# Patient Record
Sex: Female | Born: 1990 | Race: Asian | Hispanic: No | Smoking: Never smoker
Health system: Southern US, Community
[De-identification: ages and names within clinical notes are randomized; demographics above are authoritative.]

## PROBLEM LIST (undated history)

## (undated) ENCOUNTER — Inpatient Hospital Stay (HOSPITAL_COMMUNITY): Payer: Self-pay

## (undated) DIAGNOSIS — R87629 Unspecified abnormal cytological findings in specimens from vagina: Secondary | ICD-10-CM

## (undated) DIAGNOSIS — K219 Gastro-esophageal reflux disease without esophagitis: Secondary | ICD-10-CM

## (undated) DIAGNOSIS — D649 Anemia, unspecified: Secondary | ICD-10-CM

## (undated) DIAGNOSIS — K644 Residual hemorrhoidal skin tags: Secondary | ICD-10-CM

## (undated) HISTORY — DX: Anemia, unspecified: D64.9

## (undated) HISTORY — DX: Unspecified abnormal cytological findings in specimens from vagina: R87.629

## (undated) HISTORY — PX: NO PAST SURGERIES: SHX2092

## (undated) HISTORY — PX: WISDOM TOOTH EXTRACTION: SHX21

## (undated) HISTORY — DX: Residual hemorrhoidal skin tags: K64.4

---
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
## 2013-04-12 ENCOUNTER — Emergency Department (INDEPENDENT_AMBULATORY_CARE_PROVIDER_SITE_OTHER)
Admission: EM | Admit: 2013-04-12 | Discharge: 2013-04-12 | Disposition: A | Discharge status: Home / Self Care | Payer: Self-pay | Source: Home / Self Care

## 2013-04-12 ENCOUNTER — Encounter (HOSPITAL_COMMUNITY): Payer: Self-pay | Admitting: Emergency Medicine

## 2013-04-12 DIAGNOSIS — R824 Acetonuria: Secondary | ICD-10-CM

## 2013-04-12 DIAGNOSIS — R5383 Other fatigue: Secondary | ICD-10-CM

## 2013-04-12 DIAGNOSIS — R630 Anorexia: Secondary | ICD-10-CM

## 2013-04-12 LAB — POCT I-STAT, CHEM 8
Calcium, Ion: 1.26 mmol/L — ABNORMAL HIGH (ref 1.12–1.23)
Chloride: 105 mEq/L (ref 96–112)
HCT: 45 % (ref 36.0–46.0)
Sodium: 141 mEq/L (ref 135–145)
TCO2: 26 mmol/L (ref 0–100)

## 2013-04-12 LAB — POCT URINALYSIS DIP (DEVICE)
Bilirubin Urine: NEGATIVE
Glucose, UA: NEGATIVE mg/dL
Nitrite: NEGATIVE
Specific Gravity, Urine: 1.02 (ref 1.005–1.030)
Urobilinogen, UA: 0.2 mg/dL (ref 0.0–1.0)

## 2013-04-12 LAB — POCT INFECTIOUS MONO SCREEN: Mono Screen: NEGATIVE

## 2013-04-12 LAB — POCT PREGNANCY, URINE: Preg Test, Ur: NEGATIVE

## 2013-04-12 NOTE — ED Notes (Signed)
Pt c/o feeling weak and tired constantly onset 2 weeks Sx also include sensation that her face/throat is swelling, body aches, loss of appetite Denies: f/v/d, constipation, abd pain   She is alert and oriented w/no signs of acute distress.

## 2013-04-12 NOTE — ED Provider Notes (Signed)
History     CSN: [id number]  Arrival date & time 04/12/13  1249   First MD Initiated Contact with Patient 04/12/13 1310      Chief Complaint  Patient presents with  . Fatigue    (Consider location/radiation/quality/duration/timing/severity/associated sxs/prior treatment) HPI Comments: Healthy-appearing 22 year old Asian female complaining of being tired for 2 weeks. She has a sensation of swelling in the right side of throat, neck and face. He is complaining of myalgias of the shoulder and back. Upon awakening she feels tired with non restorative  sleep. She has a decrease in appetite and fatigue. LMP April 13.   History reviewed. No pertinent past medical history.  History reviewed. No pertinent past surgical history.  No family history on file.  History  Substance Use Topics  . Smoking status: Never Smoker   . Smokeless tobacco: Not on file  . Alcohol Use: No    OB History   Grav Para Term Preterm Abortions TAB SAB Ect Mult Living                  Review of Systems  Constitutional: Positive for activity change and fatigue. Negative for fever.  HENT: Positive for ear pain, sore throat, facial swelling and neck pain. Negative for congestion, rhinorrhea, trouble swallowing, postnasal drip and sinus pressure.   Gastrointestinal: Positive for nausea and constipation.  Endocrine: Negative.   Genitourinary: Negative.   Musculoskeletal: Positive for myalgias.  Skin: Negative.   Neurological: Positive for dizziness.    Allergies  Review of patient's allergies indicates no known allergies.  Home Medications  No current outpatient prescriptions on file.  BP 110/77  Pulse 89  Temp(Src) 97.8 F (36.6 C) (Oral)  Resp 16  SpO2 98%  LMP 04/11/2013  Physical Exam  Nursing note and vitals reviewed. Constitutional: She is oriented to person, place, and time. She appears well-developed and well-nourished. No distress.  Appears generally healthy. No overt signs of  illness.   HENT:  Head: Normocephalic and atraumatic.  Mouth/Throat: No oropharyngeal exudate.  Oropharynx with mild swelling and, cobblestoning and erythema. Positive clear PND. No visible swelling of the neck or face. No palpable nodules or areas of swelling.   Eyes: EOM are normal. Pupils are equal, round, and reactive to light.  Neck: Normal range of motion. Neck supple.  Cardiovascular: Normal rate and normal heart sounds.   Pulmonary/Chest: Effort normal and breath sounds normal. No respiratory distress. She has no wheezes. She has no rales.  Abdominal: Soft. There is no tenderness.  Musculoskeletal: Normal range of motion.  Lymphadenopathy:    She has no cervical adenopathy.  Neurological: She is alert and oriented to person, place, and time. No cranial nerve deficit. She exhibits normal muscle tone.  Skin: Skin is warm and dry.  Psychiatric: She has a normal mood and affect.    ED Course  Procedures (including critical care time)  Labs Reviewed  POCT I-STAT, CHEM 8 - Abnormal; Notable for the following:    Calcium, Ion 1.26 (*)    Hemoglobin 15.3 (*)    All other components within normal limits  POCT URINALYSIS DIP (DEVICE) - Abnormal; Notable for the following:    Ketones, ur 40 (*)    Hgb urine dipstick MODERATE (*)    Protein, ur 30 (*)    All other components within normal limits  POCT PREGNANCY, URINE   No results found.   1. Fatigue   2. Appetite loss   3. Ketonuria  MDM  Fatigue of unknown eitiology.  May need further labwork such as thyroid tests, WBC's, CMP and others.  Assess for other etiologies. Try to obtain PCP as soon as possible Drink more fluids. Return if worse, new symptoms or problems.         Hayden Rasmussen, NP 04/12/13 1510

## 2013-04-12 NOTE — ED Provider Notes (Signed)
Medical screening examination/treatment/procedure(s) were performed by non-physician practitioner and as supervising physician I was immediately available for consultation/collaboration.  Anastasija Anfinson   Samiksha Pellicano, MD 04/12/13 1554 

## 2013-10-04 ENCOUNTER — Encounter (HOSPITAL_COMMUNITY): Payer: Self-pay | Admitting: Emergency Medicine

## 2013-10-04 ENCOUNTER — Emergency Department (INDEPENDENT_AMBULATORY_CARE_PROVIDER_SITE_OTHER)
Admission: EM | Admit: 2013-10-04 | Discharge: 2013-10-04 | Disposition: A | Discharge status: Home / Self Care | Payer: Medicaid Other | Source: Home / Self Care | Attending: Emergency Medicine | Admitting: Emergency Medicine

## 2013-10-04 ENCOUNTER — Other Ambulatory Visit (HOSPITAL_COMMUNITY)
Admission: RE | Admit: 2013-10-04 | Discharge: 2013-10-04 | Disposition: A | Discharge status: Home / Self Care | Payer: Medicaid Other | Source: Ambulatory Visit | Attending: Emergency Medicine | Admitting: Emergency Medicine

## 2013-10-04 DIAGNOSIS — Z3201 Encounter for pregnancy test, result positive: Secondary | ICD-10-CM

## 2013-10-04 DIAGNOSIS — R112 Nausea with vomiting, unspecified: Secondary | ICD-10-CM

## 2013-10-04 DIAGNOSIS — N760 Acute vaginitis: Secondary | ICD-10-CM | POA: Insufficient documentation

## 2013-10-04 DIAGNOSIS — R109 Unspecified abdominal pain: Secondary | ICD-10-CM

## 2013-10-04 DIAGNOSIS — Z113 Encounter for screening for infections with a predominantly sexual mode of transmission: Secondary | ICD-10-CM | POA: Insufficient documentation

## 2013-10-04 DIAGNOSIS — Z3402 Encounter for supervision of normal first pregnancy, second trimester: Secondary | ICD-10-CM

## 2013-10-04 LAB — POCT URINALYSIS DIP (DEVICE)
Glucose, UA: NEGATIVE mg/dL
Leukocytes, UA: NEGATIVE
Protein, ur: NEGATIVE mg/dL
Urobilinogen, UA: 0.2 mg/dL (ref 0.0–1.0)

## 2013-10-04 LAB — POCT PREGNANCY, URINE: Preg Test, Ur: POSITIVE — AB

## 2013-10-04 MED ORDER — DOXYLAMINE SUCCINATE (SLEEP) 25 MG PO TABS: ORAL_TABLET | ORAL | Status: DC

## 2013-10-04 MED ORDER — VITAMIN B-6 100 MG PO TABS: ORAL_TABLET | ORAL | Status: DC

## 2013-10-04 MED ORDER — PRENATAL VITAMINS 0.8 MG PO TABS: 1.0000 | ORAL_TABLET | Freq: Every day | ORAL | Status: DC

## 2013-10-04 NOTE — ED Notes (Signed)
C/o no menses since 05-2013 ; frequent vomiting; NAD

## 2013-10-04 NOTE — ED Provider Notes (Signed)
Chief Complaint:   Chief Complaint  Patient presents with  . Possible Pregnancy    History of Present Illness:   Brooke Bush is 22 year old female who comes in today for confirmation of pregnancy. Last menstrual period was June 30. She's had some nausea and vomiting and urinary frequency. Her breasts have been sore and tender. The past 2 weeks she's had mild left lower quadrant pain that comes and goes lasting just a couple minutes at a time. She denies any fever, chills, nausea, vomiting, or urinary symptoms, or vaginal bleeding. She does not have a gynecologist and has had no prenatal care.  Review of Systems:  Other than noted above, the patient denies any of the following symptoms: Systemic:  No fever, chills, sweats, or weight loss. GI:  No abdominal pain, nausea, anorexia, vomiting, diarrhea, constipation, melena or hematochezia. GU:  No dysuria, frequency, urgency, hematuria, vaginal discharge, itching, or abnormal vaginal bleeding. Skin:  No rash or itching.  PMFSH:  Past medical history, family history, social history, meds, and allergies were reviewed.  Otherwise in good health. No history of high blood pressure diabetes. This will be her first pregnancy.  Physical Exam:   Vital signs:  BP 94/68  Pulse 88  Temp(Src) 98.3 F (36.8 C) (Oral)  Resp 12  SpO2 100% General:  Alert, oriented and in no distress. Lungs:  Breath sounds clear and equal bilaterally.  No wheezes, rales or rhonchi. Heart:  Regular rhythm.  No gallops or murmers. Abdomen:  Soft, flat and non-distended.  No organomegaly or mass.  No tenderness, guarding or rebound.  Bowel sounds normally active. Pelvic exam:  Normal external genitalia. Vaginal and cervical mucosa were normal. She has mild pain with cervical motion. Uterus is palpably enlarged. There is mild tenderness to palpation. She has mild bilateral adnexal tenderness and no mass. Skin:  Clear, warm and dry.  Labs:   Results for orders placed during the  hospital encounter of 10/04/13  POCT URINALYSIS DIP (DEVICE)      Result Value Range   Glucose, UA NEGATIVE  NEGATIVE mg/dL   Bilirubin Urine NEGATIVE  NEGATIVE   Ketones, ur 80 (*) NEGATIVE mg/dL   Specific Gravity, Urine 1.025  1.005 - 1.030   Hgb urine dipstick TRACE (*) NEGATIVE   pH 7.5  5.0 - 8.0   Protein, ur NEGATIVE  NEGATIVE mg/dL   Urobilinogen, UA 0.2  0.0 - 1.0 mg/dL   Nitrite NEGATIVE  NEGATIVE   Leukocytes, UA NEGATIVE  NEGATIVE  POCT PREGNANCY, URINE      Result Value Range   Preg Test, Ur POSITIVE (*) NEGATIVE    Assessment:  The encounter diagnosis was First pregnancy, second trimester.  At this point she is [redacted] weeks pregnant which does put her in the second trimester with an estimated due date of 03/28/2014. It's unlikely that the minor amount of pain that she has is due to an ectopic since she is now in the second trimester. I urged her to followup with an obstetrician as soon as possible and report any bleeding. If the pain should get worse or if she has any bleeding she is [facility]  Plan:   1.  Meds:  The following meds were prescribed:   Discharge Medication List as of 10/04/2013  1:30 PM    START taking these medications   Details  doxylamine, Sleep, (UNISOM) 25 MG tablet 1 TID as needed for morning sickness, Normal    Prenatal Multivit-Min-Fe-FA (  PRENATAL VITAMINS) 0.8 MG tablet Take 1 tablet by mouth daily., Starting 10/04/2013, Until Discontinued, Normal    pyridOXINE (VITAMIN B-6) 100 MG tablet 1 TID, Normal        2.  Patient Education/Counseling:  The patient was given appropriate handouts, self care instructions, and instructed in symptomatic relief.  She was given a handout about routine pregnancy care.  3.  Follow up:  The patient was told to follow up if no better in 3 to 4 days, if becoming worse in any way, and given some red flag symptoms such as increasing abdominal pain or bleeding which would prompt immediate  return.  Follow up at [facility].     Reuben Likes, MD 10/04/13 2224

## 2013-10-06 ENCOUNTER — Telehealth (HOSPITAL_COMMUNITY): Payer: Self-pay | Admitting: *Deleted

## 2013-10-06 ENCOUNTER — Telehealth (HOSPITAL_COMMUNITY): Payer: Self-pay | Admitting: Emergency Medicine

## 2013-10-06 MED ORDER — METRONIDAZOLE 500 MG PO TABS: 500.0000 mg | ORAL_TABLET | Freq: Two times a day (BID) | ORAL | Status: DC

## 2013-10-06 MED ORDER — TERCONAZOLE 0.4 % VA CREA: 1.0000 | TOPICAL_CREAM | Freq: Every day | VAGINAL | Status: DC

## 2013-10-06 NOTE — ED Notes (Addendum)
GC/Chlamydia neg., Affirm: Candida and Gardnerella pos., Trich neg. 10/14  Message sent to Dr. Lorenz Coaster.  He e-prescribed Terazol vaginal cream and Flagyl to [facility] on [address] today. I called cell # and it was invalid. I called home number and left a message to call. Vassie Moselle 10/06/2013

## 2013-10-06 NOTE — ED Notes (Signed)
The patient's DNA probe came back positive for Gardnerella and Candida. Since she is pregnant she will be treated with metronidazole 500 mg twice a day for one week plus terconazole vaginal cream daily for one week.  Reuben Likes, MD 10/06/13 [phone]

## 2013-10-06 NOTE — Telephone Encounter (Signed)
Message copied by Reuben Likes on Wed Oct 06, 2013  8:07 AM ------      Message from: Vassie Moselle      Created: Tue Oct 05, 2013  7:13 PM      Regarding: labs       Candida and Gardnerella pos. Rest of labs neg.  Is treatment needed for this pt.       Vassie Moselle      10/05/2013       ------

## 2013-10-07 ENCOUNTER — Telehealth (HOSPITAL_COMMUNITY): Payer: Self-pay | Admitting: *Deleted

## 2013-10-07 NOTE — ED Notes (Signed)
I called home number and left a message to call. Cell # is invalid. Brooke Bush 10/07/2013

## 2013-10-08 ENCOUNTER — Telehealth (HOSPITAL_COMMUNITY): Payer: Self-pay | Admitting: *Deleted

## 2013-10-08 NOTE — ED Notes (Signed)
I have left 2 messages on pt.'s phone. I called pt.'s emergency contact and left a message for pt. to call.  If she does not speak [place], then someone who understands English ( with her permission). Vassie Moselle 10/08/2013

## 2013-10-09 ENCOUNTER — Telehealth (HOSPITAL_COMMUNITY): Payer: Self-pay | Admitting: *Deleted

## 2013-10-09 NOTE — ED Notes (Signed)
Unable to reach pt. By phone x 3.  Confidential letter sent informing pt. of her results and where to pick up her Rx.'s. Vassie Moselle 10/09/2013

## 2013-10-11 ENCOUNTER — Telehealth (HOSPITAL_COMMUNITY): Payer: Self-pay | Admitting: *Deleted

## 2013-11-08 ENCOUNTER — Other Ambulatory Visit (HOSPITAL_COMMUNITY): Payer: Self-pay | Admitting: Nurse Practitioner

## 2013-11-08 DIAGNOSIS — Z3689 Encounter for other specified antenatal screening: Secondary | ICD-10-CM

## 2013-11-08 LAB — OB RESULTS CONSOLE GBS: GBS: POSITIVE

## 2013-11-08 LAB — OB RESULTS CONSOLE GC/CHLAMYDIA
CHLAMYDIA, DNA PROBE: NEGATIVE
Gonorrhea: NEGATIVE

## 2013-11-08 LAB — OB RESULTS CONSOLE RPR: RPR: NONREACTIVE

## 2013-11-08 LAB — OB RESULTS CONSOLE RUBELLA ANTIBODY, IGM: Rubella: IMMUNE

## 2013-11-08 LAB — OB RESULTS CONSOLE ABO/RH: RH Type: POSITIVE

## 2013-11-08 LAB — OB RESULTS CONSOLE HEPATITIS B SURFACE ANTIGEN: Hepatitis B Surface Ag: NEGATIVE

## 2013-11-08 LAB — OB RESULTS CONSOLE ANTIBODY SCREEN: ANTIBODY SCREEN: NEGATIVE

## 2013-11-08 LAB — OB RESULTS CONSOLE HIV ANTIBODY (ROUTINE TESTING): HIV: NONREACTIVE

## 2013-11-11 LAB — OB RESULTS CONSOLE GBS: STREP GROUP B AG: POSITIVE

## 2013-11-12 ENCOUNTER — Ambulatory Visit (HOSPITAL_COMMUNITY)
Admission: RE | Admit: 2013-11-12 | Discharge: 2013-11-12 | Disposition: A | Discharge status: Home / Self Care | Payer: Medicaid Other | Source: Ambulatory Visit | Attending: Nurse Practitioner | Admitting: Nurse Practitioner

## 2013-11-12 ENCOUNTER — Encounter (HOSPITAL_COMMUNITY): Payer: Self-pay

## 2013-11-12 ENCOUNTER — Ambulatory Visit (HOSPITAL_COMMUNITY): Admission: RE | Admit: 2013-11-12 | Payer: Medicaid Other | Source: Ambulatory Visit

## 2013-11-12 DIAGNOSIS — Z3689 Encounter for other specified antenatal screening: Secondary | ICD-10-CM | POA: Insufficient documentation

## 2013-11-23 ENCOUNTER — Other Ambulatory Visit (HOSPITAL_COMMUNITY): Payer: Self-pay | Admitting: Nurse Practitioner

## 2013-11-23 DIAGNOSIS — Z0489 Encounter for examination and observation for other specified reasons: Secondary | ICD-10-CM

## 2013-11-24 ENCOUNTER — Encounter: Payer: Self-pay | Admitting: Obstetrics and Gynecology

## 2013-12-10 ENCOUNTER — Ambulatory Visit (HOSPITAL_COMMUNITY)
Admission: RE | Admit: 2013-12-10 | Discharge: 2013-12-10 | Disposition: A | Discharge status: Home / Self Care | Payer: Medicaid Other | Source: Ambulatory Visit | Attending: Nurse Practitioner | Admitting: Nurse Practitioner

## 2013-12-10 DIAGNOSIS — Z1389 Encounter for screening for other disorder: Secondary | ICD-10-CM | POA: Insufficient documentation

## 2013-12-10 DIAGNOSIS — Z0489 Encounter for examination and observation for other specified reasons: Secondary | ICD-10-CM

## 2013-12-10 DIAGNOSIS — Z363 Encounter for antenatal screening for malformations: Secondary | ICD-10-CM | POA: Insufficient documentation

## 2013-12-23 NOTE — L&D Delivery Note (Signed)
Delivery Note At 8:57 AM a viable female was delivered via Vaginal, Spontaneous Delivery (Presentation: Right Occiput Anterior).  APGAR: 8, 9; weight: TBD.   Placenta status: Intact, Spontaneous.  Cord: 3 vessels with the following complications: None.  Anesthesia: Epidural  Episiotomy: None Lacerations: 2nd degree perineal; bilateral labial Suture Repair: 3.0 monocryl Est. Blood Loss (mL): 450cc  Mom to postpartum.  Baby to Couplet care / Skin to Skin.  Upon arrival patient was complete, pushing with good maternal effort, delivered healthy baby girl without significant difficulty, baby was noted to have good tone and place on maternal abdomen for oral suctioning, drying and stimulation. Delayed cord clamping performed and cut. Placenta delivered intact with 3V cord. Pitocin was started and uterus massaged until bleeding slowed Vaginal canal and perineum was inspected and 2nd degree perineal lac identified with bilateral labial lacs, rectal exam performed to confirm no extension or 3rd degree. Lacerations were repaired in the usual fashion, hemostatic on completion. . Counts of sharps, instruments, and lap pads were all correct.    Saralyn PilarAlexander Karamalegos, DO [facility], PGY-1 04/21/2014, 9:39 AM  I spoke with and examined patient and agree with resident's note and plan of care.  Tawana ScaleMichael Ryan Ajiah Mcglinn, MD OB Fellow 04/21/2014 11:56 AM

## 2014-03-11 ENCOUNTER — Other Ambulatory Visit (HOSPITAL_COMMUNITY): Payer: Self-pay | Admitting: Nurse Practitioner

## 2014-03-11 DIAGNOSIS — Z0374 Encounter for suspected problem with fetal growth ruled out: Secondary | ICD-10-CM

## 2014-03-14 ENCOUNTER — Other Ambulatory Visit (HOSPITAL_COMMUNITY): Payer: Self-pay | Admitting: Nurse Practitioner

## 2014-03-14 ENCOUNTER — Ambulatory Visit (HOSPITAL_COMMUNITY)
Admission: RE | Admit: 2014-03-14 | Discharge: 2014-03-14 | Disposition: A | Discharge status: Home / Self Care | Payer: Medicaid Other | Source: Ambulatory Visit | Attending: Nurse Practitioner | Admitting: Nurse Practitioner

## 2014-03-14 DIAGNOSIS — Z0374 Encounter for suspected problem with fetal growth ruled out: Secondary | ICD-10-CM

## 2014-03-14 DIAGNOSIS — O365990 Maternal care for other known or suspected poor fetal growth, unspecified trimester, not applicable or unspecified: Secondary | ICD-10-CM | POA: Insufficient documentation

## 2014-03-17 ENCOUNTER — Other Ambulatory Visit (HOSPITAL_COMMUNITY): Payer: Self-pay | Admitting: Nurse Practitioner

## 2014-03-17 DIAGNOSIS — IMO0002 Reserved for concepts with insufficient information to code with codable children: Secondary | ICD-10-CM

## 2014-04-04 ENCOUNTER — Other Ambulatory Visit (HOSPITAL_COMMUNITY): Payer: Self-pay | Admitting: Nurse Practitioner

## 2014-04-04 ENCOUNTER — Inpatient Hospital Stay (HOSPITAL_COMMUNITY)
Admission: AD | Admit: 2014-04-04 | Discharge: 2014-04-04 | Disposition: A | Discharge status: Home / Self Care | Payer: Medicaid Other | Source: Ambulatory Visit | Attending: Obstetrics & Gynecology | Admitting: Obstetrics & Gynecology

## 2014-04-04 ENCOUNTER — Encounter (HOSPITAL_COMMUNITY): Payer: Self-pay | Admitting: *Deleted

## 2014-04-04 ENCOUNTER — Ambulatory Visit (HOSPITAL_COMMUNITY)
Admission: RE | Admit: 2014-04-04 | Discharge: 2014-04-04 | Disposition: A | Discharge status: Home / Self Care | Payer: Medicaid Other | Source: Ambulatory Visit | Attending: Nurse Practitioner | Admitting: Nurse Practitioner

## 2014-04-04 DIAGNOSIS — IMO0002 Reserved for concepts with insufficient information to code with codable children: Secondary | ICD-10-CM

## 2014-04-04 DIAGNOSIS — O365990 Maternal care for other known or suspected poor fetal growth, unspecified trimester, not applicable or unspecified: Secondary | ICD-10-CM

## 2014-04-04 NOTE — Discharge Instructions (Signed)
Intrauterine Growth Restriction Intrauterine growth restriction (IUGR) means that the baby is smaller than normal at the time of the pregnancy or at birth. This should not be confused with Small for Gestational Age (SGA), which means the baby's weight at birth is at the lower end (less than 10%) of normal birth weights.  CAUSES  Medical problems with the mother:  High blood pressure.  Kidney, lung or heart disease.  Diabetes with arteriosclerosis.  Hemoglobinoathies- blood diseases.  Antiphospholipid antibody syndrome - a disorder of the immune system. Other causes:  Smoking, drug abuse and excessive alcohol drinking.  Diseases of the placenta.  Having twins or more.  Malnutrition.  Infections.  Genetic problems.  Pregnant women 22 years old or younger and pregnant women 22 years old or older.  Exposure to toxic chemicals. SYMPTOMS  Smaller than normal uterus when measuring the uterine size on the abdomen.  Ultrasound measurements of the fetuses head circumference, the abdominal circumference, the diameter of the biparietal area (sides) of the head and the length of the femur less than normal indicating IUGR. RISKS AND COMPLICATIONS  Fetal death in the uterus or a stillborn baby.  Not having enough fluid in the baby's sac (oligohydramnios).  Fetal heart rate problems. This leads to more Cesarean Section deliveries.  Low Apgar scores (evaluates the baby's condition at birth).  Increase in the acidity of the baby's blood (acidosis). SGA babies can also have complications such as:  Low blood sugar.  Increase of bilirubin in the blood.  Low body temperature (hypothermia)  Low Apgar scores, convulsions, fetal death and stillborn. TREATMENT   Close following and monitoring of the fetus during the pregnancy.  Treat infections that may be present.  Treat and control the medical disease present.  Look at the condition of the fetus with non-tress tests,  contraction stress tests and biophysical profile of the fetus.  Doppler ultrasound (measure the umbilical artery blood flow) lowers the risk of fetal death and stillborn by delivering the baby early if it is abnormal. HOME CARE INSTRUCTIONS   Follow your caregiver's advice, instructions and keep all of your prenatal appointments.  Get plenty of rest and sleep.  Eat a balanced diet and take all your vitamin and mineral supplements.  Do not over use your energy with hard exercise, work and household activities.  Do not exercise unless your caregiver says it is OK to do so.  Do not smoke, drink alcohol or take illegal drugs.  Avoid chemicals like pesticides. SEEK MEDICAL CARE IF:   You develop a temperature of 100 F (37.8 C) or higher.  You do not feel the baby moving as much or not at all.  You develop leaking of fluid from the vagina.  You develop vaginal bleeding.  You develop abdominal pain.  You develop uterine contractions. Document Released: 09/17/2008 Document Revised: 03/02/2012 Document Reviewed: 09/17/2008 [facility] Patient Information 2014 [place], [place].

## 2014-04-04 NOTE — MAU Provider Note (Signed)
  History     CSN: [id number]  Arrival date and time: 04/04/14 1454  Chief Complaint  Patient presents with  . Non-stress Test   HPI  23 year old female G1P0 at 1522w5d presents for NST.  Prenatal care being done by the health department. Patient is currently being followed closely for fetal growth restriction.  Ultrasound and BPP were obtained early today.  BPP 6/8 (-2 for breathing).  AFI was normal.  EFW was 21 percentile.  Abdominal circumference was <3 percentile indicating growth restriction.   Given 6/8 BPP patient was sent for NST.  Patient reports that she feels well.  No bleeding, LOF. + Fetal movement.  She does not infrequent contractions at night.  No contractions currently.   Past Medical History  Diagnosis Date  . Medical history non-contributory     Past Surgical History  Procedure Laterality Date  . No past surgeries      No family history on file.  History  Substance Use Topics  . Smoking status: Never Smoker   . Smokeless tobacco: Not on file  . Alcohol Use: No    Allergies: No Known Allergies  Prescriptions prior to admission  Medication Sig Dispense Refill  . Prenatal Multivit-Min-Fe-FA (PRENATAL VITAMINS) 0.8 MG tablet Take 1 tablet by mouth daily.  30 tablet  12  . doxylamine, Sleep, (UNISOM) 25 MG tablet 1 TID as needed for morning sickness  30 tablet  0  . metroNIDAZOLE (FLAGYL) 500 MG tablet Take 1 tablet (500 mg total) by mouth 2 (two) times daily.  14 tablet  0  . pyridOXINE (VITAMIN B-6) 100 MG tablet 1 TID  60 tablet  3  . terconazole (TERAZOL 7) 0.4 % vaginal cream Place 1 applicator vaginally at bedtime.  45 g  0    ROS Per HPI Physical Exam   Blood pressure 119/78, pulse 98, temperature 97.5 F (36.4 C), resp. rate 18, last menstrual period 04/11/2013.  Physical Exam Gen: well appearing female in NAD.  Heart: RRR.  Lungs: CTAB.  Abd: gravid but otherwise soft, nontender to palpation Ext: no appreciable lower extremity edema  bilaterally  FHR: baseline 140, mod variability, 15x15 accels, no decels Toco: None noted   MAU Course  Procedures  MDM NST   Assessment and Plan  23 year old female G1P0 at 1522w5d presents for NST. - Reactive and reassuring tracing. - Case discussed with Dr. Ike Benedom, [facility] fellow who is in agreement.  Will D/C home.  Patient needs close follow up with [facility] and will defer further management to HD.  Tommie SamsJayce G Cook 04/04/2014, 3:55 PM   I spoke with and examined patient and agree with resident's note and plan of care.  Tawana ScaleMichael Ryan Carlosdaniel Grob, MD OB Fellow 04/04/2014 11:14 PM

## 2014-04-04 NOTE — MAU Note (Signed)
Pt sent from clinic for NST 6/8 on her BPP.

## 2014-04-08 ENCOUNTER — Ambulatory Visit (INDEPENDENT_AMBULATORY_CARE_PROVIDER_SITE_OTHER): Payer: Medicaid Other | Admitting: *Deleted

## 2014-04-08 VITALS — BP 109/72

## 2014-04-08 DIAGNOSIS — O365990 Maternal care for other known or suspected poor fetal growth, unspecified trimester, not applicable or unspecified: Secondary | ICD-10-CM

## 2014-04-08 NOTE — Progress Notes (Signed)
NST reviewed and reactive.  

## 2014-04-08 NOTE — Progress Notes (Signed)
P = 99  AFI recently done on 4/13 and WNL - not repeated today. Consult with Dr. Shawnie PonsPratt - continue twice weekly fetal testing. Pt has appt @ [facility] on 4/20 for prenatal visit and NST. Next appt here on 4/23 for NST @ 1400 and AFI/doppler @ [facility] @ 1515.   Pt voiced understanding of plan of care.

## 2014-04-10 NOTE — MAU Provider Note (Signed)
Attestation of Attending Supervision of Fellow: Evaluation and management procedures were performed by the Fellow under my supervision and collaboration.  I have reviewed the Fellow's note and chart, and I agree with the management and plan.    

## 2014-04-12 ENCOUNTER — Encounter (HOSPITAL_COMMUNITY): Payer: Self-pay | Admitting: *Deleted

## 2014-04-12 ENCOUNTER — Telehealth (HOSPITAL_COMMUNITY): Payer: Self-pay | Admitting: *Deleted

## 2014-04-12 NOTE — Telephone Encounter (Signed)
Preadmission screen  

## 2014-04-14 ENCOUNTER — Ambulatory Visit (HOSPITAL_COMMUNITY)
Admission: RE | Admit: 2014-04-14 | Discharge: 2014-04-14 | Disposition: A | Discharge status: Home / Self Care | Payer: Medicaid Other | Source: Ambulatory Visit | Attending: Nurse Practitioner | Admitting: Nurse Practitioner

## 2014-04-14 ENCOUNTER — Ambulatory Visit (INDEPENDENT_AMBULATORY_CARE_PROVIDER_SITE_OTHER): Payer: Medicaid Other | Admitting: *Deleted

## 2014-04-14 VITALS — BP 108/69 | HR 99

## 2014-04-14 DIAGNOSIS — O365990 Maternal care for other known or suspected poor fetal growth, unspecified trimester, not applicable or unspecified: Secondary | ICD-10-CM

## 2014-04-14 DIAGNOSIS — Z3689 Encounter for other specified antenatal screening: Secondary | ICD-10-CM | POA: Insufficient documentation

## 2014-04-14 NOTE — Progress Notes (Signed)
NST reactive 04/14/14

## 2014-04-14 NOTE — Progress Notes (Signed)
US for AFI/doppler today @ [facility].  Next NST @ [facility] on 4/27.  IOL is scheduled on 4/29.

## 2014-04-20 ENCOUNTER — Encounter (HOSPITAL_COMMUNITY): Payer: Self-pay

## 2014-04-20 ENCOUNTER — Inpatient Hospital Stay (HOSPITAL_COMMUNITY)
Admission: RE | Admit: 2014-04-20 | Discharge: 2014-04-23 | DRG: 775 | Disposition: A | Discharge status: Home / Self Care | Payer: Medicaid Other | Source: Ambulatory Visit | Attending: Obstetrics and Gynecology | Admitting: Obstetrics and Gynecology

## 2014-04-20 DIAGNOSIS — O9989 Other specified diseases and conditions complicating pregnancy, childbirth and the puerperium: Secondary | ICD-10-CM

## 2014-04-20 DIAGNOSIS — O365990 Maternal care for other known or suspected poor fetal growth, unspecified trimester, not applicable or unspecified: Principal | ICD-10-CM | POA: Diagnosis present

## 2014-04-20 DIAGNOSIS — O99892 Other specified diseases and conditions complicating childbirth: Secondary | ICD-10-CM | POA: Diagnosis present

## 2014-04-20 DIAGNOSIS — O365930 Maternal care for other known or suspected poor fetal growth, third trimester, not applicable or unspecified: Secondary | ICD-10-CM | POA: Diagnosis present

## 2014-04-20 DIAGNOSIS — Z22330 Carrier of Group B streptococcus: Secondary | ICD-10-CM

## 2014-04-20 LAB — CBC
HCT: 35.4 % — ABNORMAL LOW (ref 36.0–46.0)
HEMOGLOBIN: 12.7 g/dL (ref 12.0–15.0)
MCH: 27.7 pg (ref 26.0–34.0)
MCHC: 35.9 g/dL (ref 30.0–36.0)
MCV: 77.1 fL — ABNORMAL LOW (ref 78.0–100.0)
Platelets: 261 10*3/uL (ref 150–400)
RBC: 4.59 MIL/uL (ref 3.87–5.11)
RDW: 13.8 % (ref 11.5–15.5)
WBC: 12.3 10*3/uL — ABNORMAL HIGH (ref 4.0–10.5)

## 2014-04-20 MED ORDER — ZOLPIDEM TARTRATE 5 MG PO TABS
5.0000 mg | ORAL_TABLET | Freq: Every evening | ORAL | Status: DC | PRN
  Administered 2014-04-20: 5 mg via ORAL
  Filled 2014-04-20: qty 1

## 2014-04-20 MED ORDER — LIDOCAINE HCL (PF) 1 % IJ SOLN
30.0000 mL | INTRAMUSCULAR | Status: AC | PRN
  Administered 2014-04-21: 30 mL via SUBCUTANEOUS
  Filled 2014-04-20: qty 30

## 2014-04-20 MED ORDER — MISOPROSTOL 25 MCG QUARTER TABLET
25.0000 ug | ORAL_TABLET | ORAL | Status: DC | PRN
  Administered 2014-04-20 – 2014-04-21 (×2): 25 ug via VAGINAL
  Filled 2014-04-20 (×2): qty 0.25
  Filled 2014-04-20: qty 1

## 2014-04-20 MED ORDER — TERBUTALINE SULFATE 1 MG/ML IJ SOLN: 0.2500 mg | Freq: Once | INTRAMUSCULAR | Status: AC | PRN

## 2014-04-20 MED ORDER — ONDANSETRON HCL 4 MG/2ML IJ SOLN: 4.0000 mg | Freq: Four times a day (QID) | INTRAMUSCULAR | Status: DC | PRN

## 2014-04-20 MED ORDER — LACTATED RINGERS IV SOLN
INTRAVENOUS | Status: DC
  Administered 2014-04-20: 20:00:00 via INTRAVENOUS

## 2014-04-20 MED ORDER — PENICILLIN G POTASSIUM 5000000 UNITS IJ SOLR
2.5000 10*6.[IU] | INTRAVENOUS | Status: DC
  Administered 2014-04-21 (×2): 2.5 10*6.[IU] via INTRAVENOUS
  Filled 2014-04-20 (×4): qty 2.5

## 2014-04-20 MED ORDER — ACETAMINOPHEN 325 MG PO TABS: 650.0000 mg | ORAL_TABLET | ORAL | Status: DC | PRN

## 2014-04-20 MED ORDER — PENICILLIN G POTASSIUM 5000000 UNITS IJ SOLR
5.0000 10*6.[IU] | Freq: Once | INTRAVENOUS | Status: AC
  Administered 2014-04-20: 5 10*6.[IU] via INTRAVENOUS
  Filled 2014-04-20: qty 5

## 2014-04-20 MED ORDER — OXYTOCIN 40 UNITS IN LACTATED RINGERS INFUSION - SIMPLE MED
62.5000 mL/h | INTRAVENOUS | Status: DC
Start: 2014-04-20 — End: 2014-04-21
  Administered 2014-04-21: 62.5 mL/h via INTRAVENOUS
  Filled 2014-04-20: qty 1000

## 2014-04-20 MED ORDER — CITRIC ACID-SODIUM CITRATE 334-500 MG/5ML PO SOLN: 30.0000 mL | ORAL | Status: DC | PRN

## 2014-04-20 MED ORDER — OXYTOCIN BOLUS FROM INFUSION: 500.0000 mL | INTRAVENOUS | Status: DC

## 2014-04-20 MED ORDER — LACTATED RINGERS IV SOLN
500.0000 mL | INTRAVENOUS | Status: DC | PRN
  Administered 2014-04-21: 500 mL via INTRAVENOUS

## 2014-04-20 MED ORDER — OXYCODONE-ACETAMINOPHEN 5-325 MG PO TABS: 1.0000 | ORAL_TABLET | ORAL | Status: DC | PRN

## 2014-04-20 MED ORDER — IBUPROFEN 600 MG PO TABS
600.0000 mg | ORAL_TABLET | Freq: Four times a day (QID) | ORAL | Status: DC | PRN
  Administered 2014-04-21: 600 mg via ORAL
  Filled 2014-04-20: qty 1

## 2014-04-20 NOTE — Progress Notes (Signed)
Provider places cytotec per orders.  Reviews strip and uc pattern

## 2014-04-20 NOTE — H&P (Signed)
Brooke Bush is a 23 y.o. G1P0 @ 4512w0d weeks gestation by first trimester US admitted for IOL for IUGR.  She a pt of [facility]. US 04/04/14 EFW was 21 percentile. Abdominal circumference was <3 percentile. Pt denies vaginal bleeding, no LOF, membranes intact. No headache or changes in vision. Currently feeing comfortable.   History OB History   Grav Para Term Preterm Abortions TAB SAB Ect Mult Living   1              Past Medical History  Diagnosis Date  . Medical history non-contributory   . Vaginal Pap smear, abnormal    Past Surgical History  Procedure Laterality Date  . No past surgeries     Family History: family history is not on file. Social History:  reports that she has never smoked. She does not have any smokeless tobacco history on file. She reports that she does not drink alcohol or use illicit drugs.   Prenatal Transfer Tool  Maternal Diabetes: No Genetic Screening: Declined Maternal Ultrasounds/Referrals: Abnormal:  Findings:   IUGR, Other:  Fetal Ultrasounds or other Referrals:  None Maternal Substance Abuse:  No Significant Maternal Medications:  None Significant Maternal Lab Results:  Lab values include: Group B Strep positive Other Comments:  US 04/04/14 EFW was 21 percentile. Abdominal circumference was <3 percentile.  ROS HEENT: No headache or changes in vision CV: no chest pain Lungs: No dyspnea Abdomen: not feeling contractions Ext: no edema  Dilation: Fingertip Effacement (%): Thick Station: -2 Exam by:: DR. Marily Lenteestrepo Blood pressure 113/75, pulse 87, temperature 97.9 F (36.6 C), temperature source Oral, resp. rate 18, height 5' 1.5" (1.562 m), weight 53.978 kg (119 lb), last menstrual period 04/11/2013.  FHT: baseline 130s, + accels, moderate variability, no decels, Category I tracing Uterine Contractions: Irregular Exam Physical Exam  Gen: NAD CV: RRR, No MRG Lungs: CTA BL Abdomen: gravid, vertex Ext: No edema  Prenatal labs: ABO, Rh:  O/Positive/-- (11/17 0000) Antibody: Negative (11/17 0000) Rubella: Immune (11/17 0000) RPR: Nonreactive (11/17 0000)  HBsAg: Negative (11/17 0000)  HIV: Non-reactive (11/17 0000)  GBS: Positive (11/20 0000)   Assessment/Plan: Brooke Bush is a 23 y.o. G1P0 @ 412w0d weeks gestation by first trimester US admitted for IOL for IUGR.  Labor: unfavorable cervix, Bishop 1, will start cytotec Preeclampsia: NA Fetal Wellbeing: Category I  Pain Control: Undecided on IV analgesia vs Epidural I/D: GBS pos, needs intrapartum Abx Anticipated MOD: NSVD   Robyn H Restrepo 04/20/2014, 10:33 PM   I have seen this patient and agree with the above resident's note.  LEFTWICH-KIRBY, Murl Zogg Certified Nurse-Midwife

## 2014-04-21 ENCOUNTER — Encounter (HOSPITAL_COMMUNITY): Payer: Medicaid Other | Admitting: Anesthesiology

## 2014-04-21 ENCOUNTER — Encounter (HOSPITAL_COMMUNITY): Payer: Self-pay

## 2014-04-21 ENCOUNTER — Inpatient Hospital Stay (HOSPITAL_COMMUNITY): Payer: Medicaid Other | Admitting: Anesthesiology

## 2014-04-21 DIAGNOSIS — O9989 Other specified diseases and conditions complicating pregnancy, childbirth and the puerperium: Secondary | ICD-10-CM

## 2014-04-21 DIAGNOSIS — O99892 Other specified diseases and conditions complicating childbirth: Secondary | ICD-10-CM

## 2014-04-21 DIAGNOSIS — O365990 Maternal care for other known or suspected poor fetal growth, unspecified trimester, not applicable or unspecified: Secondary | ICD-10-CM

## 2014-04-21 LAB — TYPE AND SCREEN
ABO/RH(D): O POS
Antibody Screen: NEGATIVE

## 2014-04-21 LAB — RPR

## 2014-04-21 LAB — ABO/RH: ABO/RH(D): O POS

## 2014-04-21 MED ORDER — IBUPROFEN 600 MG PO TABS
600.0000 mg | ORAL_TABLET | Freq: Four times a day (QID) | ORAL | Status: DC
  Administered 2014-04-21 – 2014-04-23 (×7): 600 mg via ORAL
  Filled 2014-04-21 (×8): qty 1

## 2014-04-21 MED ORDER — LANOLIN HYDROUS EX OINT: TOPICAL_OINTMENT | CUTANEOUS | Status: DC | PRN

## 2014-04-21 MED ORDER — EPHEDRINE 5 MG/ML INJ
10.0000 mg | INTRAVENOUS | Status: DC | PRN
  Filled 2014-04-21: qty 2

## 2014-04-21 MED ORDER — ONDANSETRON HCL 4 MG/2ML IJ SOLN: 4.0000 mg | INTRAMUSCULAR | Status: DC | PRN

## 2014-04-21 MED ORDER — SIMETHICONE 80 MG PO CHEW: 80.0000 mg | CHEWABLE_TABLET | ORAL | Status: DC | PRN

## 2014-04-21 MED ORDER — PHENYLEPHRINE 40 MCG/ML (10ML) SYRINGE FOR IV PUSH (FOR BLOOD PRESSURE SUPPORT)
80.0000 ug | PREFILLED_SYRINGE | INTRAVENOUS | Status: DC | PRN
  Filled 2014-04-21: qty 2

## 2014-04-21 MED ORDER — EPHEDRINE 5 MG/ML INJ
INTRAVENOUS | Status: AC
  Filled 2014-04-21: qty 4

## 2014-04-21 MED ORDER — OXYCODONE-ACETAMINOPHEN 5-325 MG PO TABS: 1.0000 | ORAL_TABLET | ORAL | Status: DC | PRN

## 2014-04-21 MED ORDER — FENTANYL 2.5 MCG/ML BUPIVACAINE 1/10 % EPIDURAL INFUSION (WH - ANES)
INTRAMUSCULAR | Status: AC
  Filled 2014-04-21: qty 125

## 2014-04-21 MED ORDER — FENTANYL 2.5 MCG/ML BUPIVACAINE 1/10 % EPIDURAL INFUSION (WH - ANES)
INTRAMUSCULAR | Status: DC | PRN
  Administered 2014-04-21: 12 mL/h via EPIDURAL

## 2014-04-21 MED ORDER — SENNOSIDES-DOCUSATE SODIUM 8.6-50 MG PO TABS
2.0000 | ORAL_TABLET | ORAL | Status: DC
  Administered 2014-04-21 – 2014-04-23 (×2): 2 via ORAL
  Filled 2014-04-21 (×2): qty 2

## 2014-04-21 MED ORDER — DIPHENHYDRAMINE HCL 25 MG PO CAPS: 25.0000 mg | ORAL_CAPSULE | Freq: Four times a day (QID) | ORAL | Status: DC | PRN

## 2014-04-21 MED ORDER — PRENATAL MULTIVITAMIN CH
1.0000 | ORAL_TABLET | Freq: Every day | ORAL | Status: DC
Start: 2014-04-21 — End: 2014-04-23
  Administered 2014-04-21 – 2014-04-23 (×3): 1 via ORAL
  Filled 2014-04-21 (×3): qty 1

## 2014-04-21 MED ORDER — ZOLPIDEM TARTRATE 5 MG PO TABS: 5.0000 mg | ORAL_TABLET | Freq: Every evening | ORAL | Status: DC | PRN

## 2014-04-21 MED ORDER — LIDOCAINE HCL (PF) 1 % IJ SOLN
INTRAMUSCULAR | Status: DC | PRN
  Administered 2014-04-21 (×2): 4 mL

## 2014-04-21 MED ORDER — PHENYLEPHRINE 40 MCG/ML (10ML) SYRINGE FOR IV PUSH (FOR BLOOD PRESSURE SUPPORT)
PREFILLED_SYRINGE | INTRAVENOUS | Status: AC
  Filled 2014-04-21: qty 10

## 2014-04-21 MED ORDER — FENTANYL CITRATE 0.05 MG/ML IJ SOLN
100.0000 ug | INTRAMUSCULAR | Status: DC | PRN
  Administered 2014-04-21 (×2): 100 ug via INTRAVENOUS
  Filled 2014-04-21 (×2): qty 2

## 2014-04-21 MED ORDER — DIPHENHYDRAMINE HCL 50 MG/ML IJ SOLN: 12.5000 mg | INTRAMUSCULAR | Status: DC | PRN

## 2014-04-21 MED ORDER — ONDANSETRON HCL 4 MG PO TABS: 4.0000 mg | ORAL_TABLET | ORAL | Status: DC | PRN

## 2014-04-21 MED ORDER — TETANUS-DIPHTH-ACELL PERTUSSIS 5-2.5-18.5 LF-MCG/0.5 IM SUSP: 0.5000 mL | Freq: Once | INTRAMUSCULAR | Status: DC

## 2014-04-21 MED ORDER — FENTANYL 2.5 MCG/ML BUPIVACAINE 1/10 % EPIDURAL INFUSION (WH - ANES): 14.0000 mL/h | INTRAMUSCULAR | Status: DC | PRN

## 2014-04-21 MED ORDER — PHENYLEPHRINE 40 MCG/ML (10ML) SYRINGE FOR IV PUSH (FOR BLOOD PRESSURE SUPPORT)
80.0000 ug | PREFILLED_SYRINGE | INTRAVENOUS | Status: DC | PRN
  Administered 2014-04-21: 80 ug via INTRAVENOUS
  Filled 2014-04-21: qty 2

## 2014-04-21 MED ORDER — BENZOCAINE-MENTHOL 20-0.5 % EX AERO
1.0000 "application " | INHALATION_SPRAY | CUTANEOUS | Status: DC | PRN
  Administered 2014-04-21: 1 via TOPICAL
  Filled 2014-04-21: qty 56

## 2014-04-21 MED ORDER — LACTATED RINGERS IV SOLN: 500.0000 mL | Freq: Once | INTRAVENOUS | Status: DC

## 2014-04-21 MED ORDER — WITCH HAZEL-GLYCERIN EX PADS: 1.0000 "application " | MEDICATED_PAD | CUTANEOUS | Status: DC | PRN

## 2014-04-21 MED ORDER — DIBUCAINE 1 % RE OINT: 1.0000 "application " | TOPICAL_OINTMENT | RECTAL | Status: DC | PRN

## 2014-04-21 NOTE — Progress Notes (Signed)
UR completed 

## 2014-04-21 NOTE — Anesthesia Procedure Notes (Signed)
Epidural Patient location during procedure: OB Start time: 04/21/2014 6:44 AM End time: 04/21/2014 6:54 AM  Staffing Anesthesiologist: Lewie LoronGERMEROTH, Iveliz Garay R Performed by: anesthesiologist   Preanesthetic Checklist Completed: patient identified, pre-op evaluation, timeout performed, IV checked, risks and benefits discussed and monitors and equipment checked  Epidural Patient position: sitting Prep: site prepped and draped and DuraPrep Patient monitoring: heart rate Approach: midline Location: L2-L3 Injection technique: LOR air and LOR saline  Needle:  Needle type: Tuohy  Needle gauge: 17 G Needle length: 9 cm Needle insertion depth: 5 cm Catheter type: closed end flexible Catheter size: 19 Gauge Catheter at skin depth: 11 cm Test dose: negative  Assessment Sensory level: T8 Events: blood not aspirated, injection not painful, no injection resistance, negative IV test and no paresthesia  Additional Notes Reason for block:procedure for pain

## 2014-04-21 NOTE — Progress Notes (Signed)
Delivery of live viable female by Dr Idelle LeechKaramelgos. Assisted by Dr Ike Benedom. APGARS 8 ,9

## 2014-04-21 NOTE — Anesthesia Postprocedure Evaluation (Signed)
  Anesthesia Post-op Note  Patient: Brooke Bush  Procedure(s) Performed: * No procedures listed *  Patient Location: Mother/Baby  Anesthesia Type:Epidural  Level of Consciousness: awake, alert , oriented and patient cooperative  Airway and Oxygen Therapy: Patient Spontanous Breathing  Post-op Pain: mild  Post-op Assessment: Patient's Cardiovascular Status Stable, Respiratory Function Stable, No headache, No backache, No residual numbness and No residual motor weakness  Post-op Vital Signs: stable  Last Vitals:  Filed Vitals:   04/21/14 1210  BP: 97/60  Pulse: 103  Temp: 36.6 C  Resp: 18    Complications: No apparent anesthesia complications

## 2014-04-21 NOTE — Anesthesia Preprocedure Evaluation (Signed)
Anesthesia Evaluation  Patient identified by MRN, date of birth, ID band Patient awake    Reviewed: Allergy & Precautions, H&P , NPO status , Patient's Chart, lab work & pertinent test results  Airway Mallampati: II TM Distance: >3 FB     Dental   Pulmonary neg pulmonary ROS,          Cardiovascular negative cardio ROS  Rhythm:Regular     Neuro/Psych negative neurological ROS  negative psych ROS   GI/Hepatic negative GI ROS, Neg liver ROS,   Endo/Other  negative endocrine ROS  Renal/GU negative Renal ROS     Musculoskeletal negative musculoskeletal ROS (+)   Abdominal   Peds  Hematology negative hematology ROS (+)   Anesthesia Other Findings   Reproductive/Obstetrics (+) Pregnancy                           Anesthesia Physical Anesthesia Plan  ASA: II  Anesthesia Plan: Epidural   Post-op Pain Management:    Induction:   Airway Management Planned:   Additional Equipment:   Intra-op Plan:   Post-operative Plan:   Informed Consent: I have reviewed the patients History and Physical, chart, labs and discussed the procedure including the risks, benefits and alternatives for the proposed anesthesia with the patient or authorized representative who has indicated his/her understanding and acceptance.     Plan Discussed with:   Anesthesia Plan Comments:         Anesthesia Quick Evaluation  

## 2014-04-21 NOTE — Progress Notes (Signed)
Brooke Bush is a 23 y.o. G1P0 @ 2869w0d weeks gestation by first trimester US admitted for IOL for IUGR. She a pt of [facility].  US 04/04/14 EFW was 21 percentile. Abdominal circumference was <3 percentile.  Subjective: Pt is feeling contractions and LOF. Positive fetal movement.  Objective: BP 113/82  Pulse 97  Temp(Src) 98.3 F (36.8 C) (Oral)  Resp 18  Ht 5' 1.5" (1.562 m)  Wt 53.978 kg (119 lb)  BMI 22.12 kg/m2  LMP 04/11/2013      FHT:  Baseline 140s, + accels, minimal variability, no decels, category II tracing UC:  Irregular  SVE:   Dilation: Fingertip Effacement (%): 50 Station: -2 Exam by:: Dr. Helayne Bush  Labs: Lab Results  Component Value Date   WBC 12.3* 04/20/2014   HGB 12.7 04/20/2014   HCT 35.4* 04/20/2014   MCV 77.1* 04/20/2014   PLT 261 04/20/2014    Assessment / Plan: 23 y.o. G1P0 @ 6269w0d weeks gestation by first trimester US admitted for IOL for IUGR.  Labor: Progressing normally, now 50% effaced, just placed second dose of cytotec for cervical ripening  Preeclampsia:  NA Fetal Wellbeing:  Category II Pain Control:  Labor support without medications for now. Pt has been given options including IV and epidural.  I/D:  GBS positive, intrapartum PCN Anticipated MOD:  NSVD  Brooke Bush 04/21/2014, 3:12 AM

## 2014-04-21 NOTE — Progress Notes (Signed)
Dieu H Newman is a 23 y.o. G1P0 @ 8362w0d weeks gestation by first trimester US admitted for IOL for IUGR. She a pt of [facility].  US 04/04/14 EFW was 21 percentile. Abdominal circumference was <3 percentile.  Subjective: Pt is feeling much more uncomfortable with contractions now. Positive fetal movement.  Objective: BP 113/82  Pulse 97  Temp(Src) 98.3 F (36.8 C) (Oral)  Resp 18  Ht 5' 1.5" (1.562 m)  Wt 53.978 kg (119 lb)  BMI 22.12 kg/m2  LMP 04/11/2013      FHT:  Baseline 130s, + accels, minimal variability, no decels, category II tracing UC:  Every 2 minutes SVE:   Dilation: 5 Effacement (%): 90 Station: -1 Exam by:: L. Leftwich-Kirby,CNM  Labs: Lab Results  Component Value Date   WBC 12.3* 04/20/2014   HGB 12.7 04/20/2014   HCT 35.4* 04/20/2014   MCV 77.1* 04/20/2014   PLT 261 04/20/2014    Assessment / Plan: 23 y.o. G1P0 @ 3562w0d weeks gestation by first trimester US admitted for IOL for IUGR.  Labor: s/p 2 doses of cytotec, progressing adequately  Preeclampsia:  NA Fetal Wellbeing:  Category II Pain Control:  Received IV medications, desires epidural I/D:  GBS positive, intrapartum PCN Anticipated MOD:  NSVD  Lilyian Quayle H Johm Pfannenstiel 04/21/2014, 6:18 AM

## 2014-04-22 LAB — CBC
HEMATOCRIT: 29.2 % — AB (ref 36.0–46.0)
HEMOGLOBIN: 10.3 g/dL — AB (ref 12.0–15.0)
MCH: 27.8 pg (ref 26.0–34.0)
MCHC: 35.3 g/dL (ref 30.0–36.0)
MCV: 78.7 fL (ref 78.0–100.0)
Platelets: 197 10*3/uL (ref 150–400)
RBC: 3.71 MIL/uL — ABNORMAL LOW (ref 3.87–5.11)
RDW: 13.5 % (ref 11.5–15.5)
WBC: 16.5 10*3/uL — ABNORMAL HIGH (ref 4.0–10.5)

## 2014-04-22 NOTE — Progress Notes (Signed)
Post Partum Day #1 Subjective: no complaints, up ad lib, voiding, tolerating PO and + flatus, Lochia rubra. Some difficulty with breastfeeding, supplementing with bottle.  Objective: Blood pressure 110/74, pulse 79, temperature 98.3 F (36.8 C), temperature source Axillary, resp. rate 18, height 5' 1.5" (1.562 m), weight 53.978 kg (119 lb), last menstrual period 04/11/2013, SpO2 100.00%, unknown if currently breastfeeding.  Physical Exam:  General: alert and no distress CV: RRR, No MRG Lungs: CTA BL Lochia: appropriate Uterine Fundus: Firm, below the umbilicus Incision: NA DVT Evaluation: No evidence of DVT seen on physical exam. Negative Homan's sign.   Recent Labs  04/20/14 2140 04/22/14 0615  HGB 12.7 10.3*  HCT 35.4* 29.2*    Assessment/Plan: Plan for discharge tomorrow, Breastfeeding, Lactation consult and Contraception Nexplanon   LOS: 2 days   Myriam JacobsonRobyn H Restrepo 04/22/2014, 7:22 AM   I have seen and examined this patient and agree with above documentation in the resident's note.   Rulon AbideKeli Javel Hersh, M.D. [facility] 04/22/2014 10:19 AM

## 2014-04-22 NOTE — Lactation Note (Signed)
This note was copied from the chart of Brooke Bush. Lactation Consultation Note Baby's blood sugar had been low. Supplementing w/formula in bottles. STS encouraged. Mom states she is going to doing breast and bottle. Encouraged to BF 1st, STS, and then formula feed. Noted no BF since 1900. Mom stated that she didn't have anything yet. Hand expression encouraged, but mom didn't appear to be wanting to do that at this time. Mom was up in rm. Had just came back from [place]. Explained about the stimulation to the Breast encourages the milk to come and is good for the baby's blood sugar.  Patient Name: Brooke Thea SilversmithDieu H Rufus XBJYN'WToday's Date: 04/22/2014     Maternal Data    Feeding    LATCH Score/Interventions                      Lactation Tools Discussed/Used     Consult Status      Charyl DancerLaura G Griffyn Kucinski 04/22/2014, 3:02 AM

## 2014-04-22 NOTE — Progress Notes (Signed)
I have seen this patient and agree with the above resident's note.  LEFTWICH-KIRBY, Shadai Mcclane Certified Nurse-Midwife 

## 2014-04-23 MED ORDER — IBUPROFEN 600 MG PO TABS: 600.0000 mg | ORAL_TABLET | Freq: Four times a day (QID) | ORAL | Status: DC | PRN

## 2014-04-23 NOTE — Discharge Summary (Signed)
Attestation of Attending Supervision of Advanced Practitioner (CNM/NP): Evaluation and management procedures were performed by the Advanced Practitioner under my supervision and collaboration.  I have reviewed the Advanced Practitioner's note and chart, and I agree with the management and plan.  Brooke Bush 04/23/2014 7:47 AM

## 2014-04-23 NOTE — Discharge Instructions (Signed)

## 2014-04-23 NOTE — Discharge Summary (Signed)
Obstetric Discharge Summary Reason for Admission: induction of labor for IUGR Prenatal Procedures: none Intrapartum Procedures: spontaneous vaginal delivery Postpartum Procedures: none Complications-Operative and Postpartum: 2nd degree perineal laceration & bilat labial Hemoglobin  Date Value Ref Range Status  04/22/2014 10.3* 12.0 - 15.0 g/dL Final     REPEATED TO VERIFY     DELTA CHECK NOTED     HCT  Date Value Ref Range Status  04/22/2014 29.2* 36.0 - 46.0 % Final   Ms Brooke Bush is a 23yo G1 admitted on the evening of 04/20/14 at 39wks for IOL due to IUGR (EFW 21%, abd circ 3<). Cytotec was placed x 2 for ripening. Later in the morning of 4/30 pt progressed to SVD. By PPD#2 she was doing well and was deemed to have received the full benefit of her hospital stay. She is bottlefeeding and desires Nexplanon for contraception.  Physical Exam:  General: alert, cooperative and no distress Heart: RRR Lungs: nl effort Lochia: appropriate Uterine Fundus: firm DVT Evaluation: No evidence of DVT seen on physical exam.  Discharge Diagnoses: Term Pregnancy-delivered  Discharge Information: Date: 04/23/2014 Activity: pelvic rest Diet: routine Medications: PNV and Ibuprofen Condition: stable Instructions: refer to practice specific booklet Discharge to: home Follow-up Information   [facility]. (For your postpartum appointment.)    Contact information:   [address] [place] [address] [phone]      Newborn Data: Live born female  Birth Weight: 5 lb 15 oz (2693 g) APGAR: 8, 9  Home with mother.  Brooke Bush 04/23/2014, 7:15 AM

## 2014-04-24 NOTE — H&P (Signed)
Attestation of Attending Supervision of Advanced Practitioner: Evaluation and management procedures were performed by the PA/NP/CNM/OB Fellow under my supervision/collaboration. Chart reviewed and agree with management and plan.  Tilda BurrowJohn V Rikayla Demmon 04/24/2014 7:29 AM

## 2014-04-25 ENCOUNTER — Encounter: Payer: Self-pay | Admitting: Obstetrics & Gynecology

## 2014-06-02 ENCOUNTER — Encounter: Payer: Self-pay | Admitting: Medical

## 2014-06-02 ENCOUNTER — Ambulatory Visit (INDEPENDENT_AMBULATORY_CARE_PROVIDER_SITE_OTHER): Payer: Medicaid Other | Admitting: Medical

## 2014-06-02 VITALS — BP 101/69 | HR 102 | Temp 97.7°F | Ht 61.0 in | Wt 101.7 lb

## 2014-06-02 DIAGNOSIS — Z3049 Encounter for surveillance of other contraceptives: Secondary | ICD-10-CM

## 2014-06-02 DIAGNOSIS — IMO0001 Reserved for inherently not codable concepts without codable children: Secondary | ICD-10-CM

## 2014-06-02 LAB — POCT PREGNANCY, URINE: Preg Test, Ur: NEGATIVE

## 2014-06-02 MED ORDER — MEDROXYPROGESTERONE ACETATE 104 MG/0.65ML ~~LOC~~ SUSP
104.0000 mg | Freq: Once | SUBCUTANEOUS | Status: AC
  Administered 2014-06-02: 104 mg via SUBCUTANEOUS

## 2014-06-02 NOTE — Patient Instructions (Signed)

## 2014-06-02 NOTE — Progress Notes (Signed)
Patient ID: Brooke Bush, female   DOB: 10-16-1991, 23 y.o.   MRN: [id number] Subjective:     Brooke Bush is a 23 y.o. female who presents for a postpartum visit. She is 6 weeks postpartum following a spontaneous vaginal delivery. I have fully reviewed the prenatal and intrapartum course. The delivery was at 39.1 gestational weeks. Outcome: spontaneous vaginal delivery. Anesthesia: epidural. Postpartum course has been normal. Baby's course has been normal. Baby is feeding by breast and bottle - Gerber gentle.  Bleeding staining only. Bowel function is normal. Bladder function is normal. Patient is not sexually active. Contraception method is none. Postpartum depression screening: negative.  The following portions of the patient's history were reviewed and updated as appropriate: allergies, current medications, past family history, past medical history, past social history, past surgical history and problem list.  Review of Systems Pertinent items are noted in HPI.   Objective:    BP 101/69  Pulse 102  Temp(Src) 97.7 F (36.5 C) (Oral)  Ht 5\' 1"  (1.549 m)  Wt 101 lb 11.2 oz (46.131 kg)  BMI 19.23 kg/m2  Breastfeeding? Yes  General:  alert and cooperative   Breasts:  not performed  Lungs: clear to auscultation bilaterally  Heart:  regular rate and rhythm, S1, S2 normal, no murmur, click, rub or gallop  Abdomen: soft, non-tender; bowel sounds normal; no masses,  no organomegaly   Vulva:  not evaluated  Vagina: not evaluated  Cervix:  not evaluated  Corpus: not examined  Adnexa:  Not examined  Rectal Exam: Not performed.        Assessment:     Normal postpartum exam. Pap smear not done at today's visit.  Birth control options discussed Patient desires Depo Provera Plan:    1. Contraception: Depo-Provera injections 2. Follow up in: 3 months for next Depo Provera injection or as needed.

## 2014-06-18 IMAGING — US US OB FOLLOW-UP
1 series · 12 of 28 positions shown · non-contrast
Comparison: none

[Series 1: us ob follow up · 73 acquisitions, 12 frames shown]
[im 3/73]
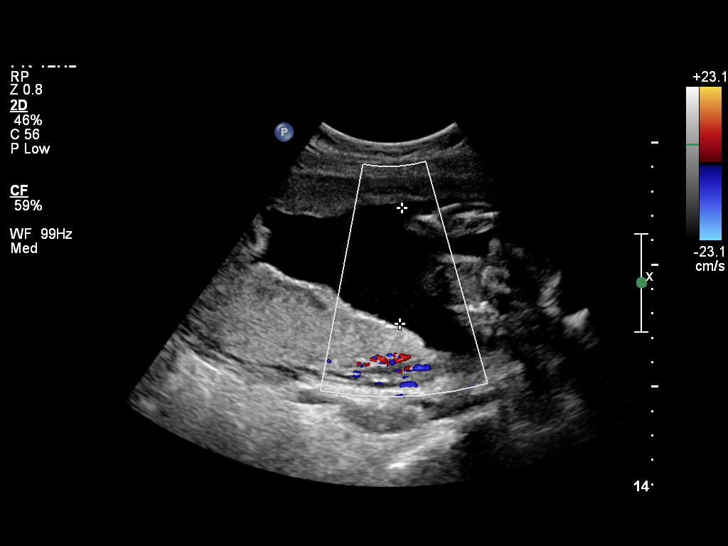
[im 9/73]
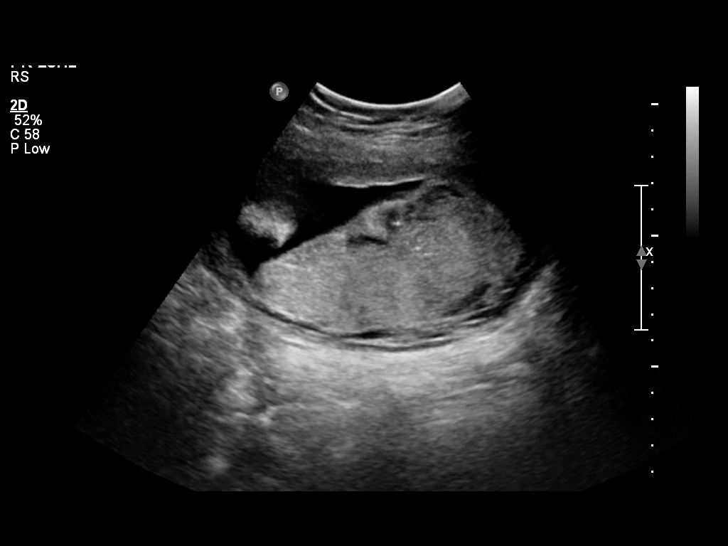
[im 14/73]
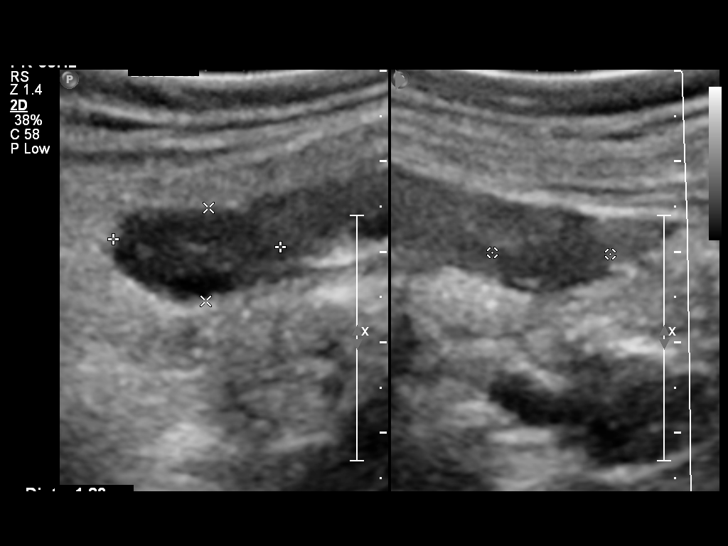
[im 22/73]
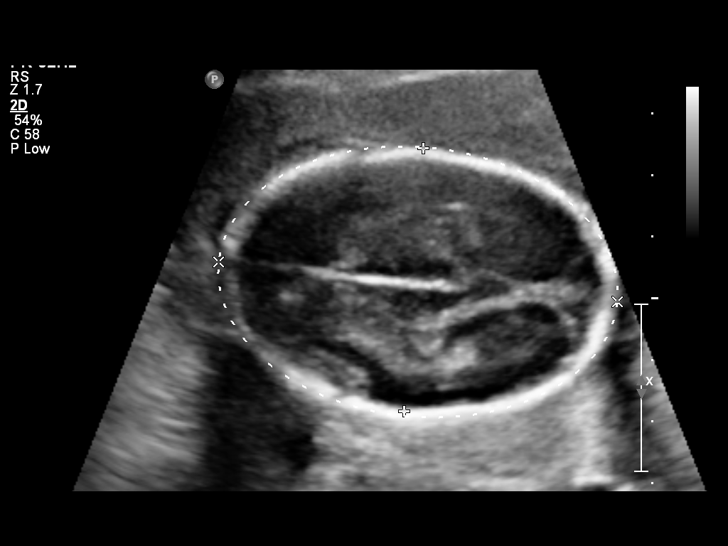
[im 27/73]
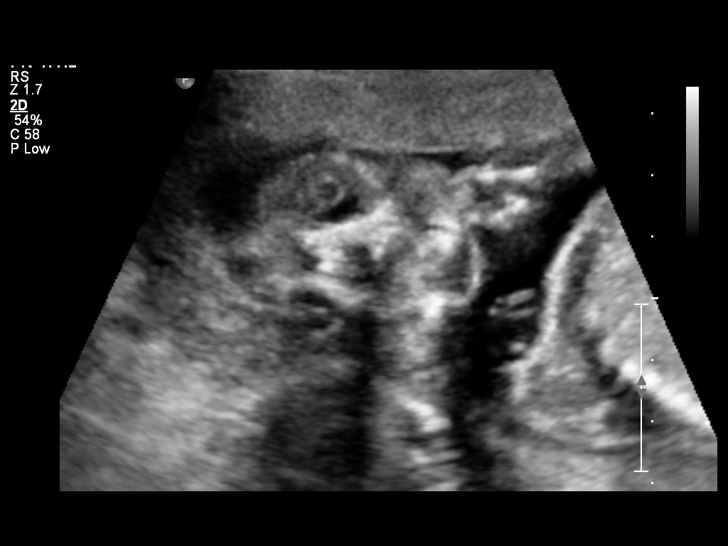
[im 33/73]
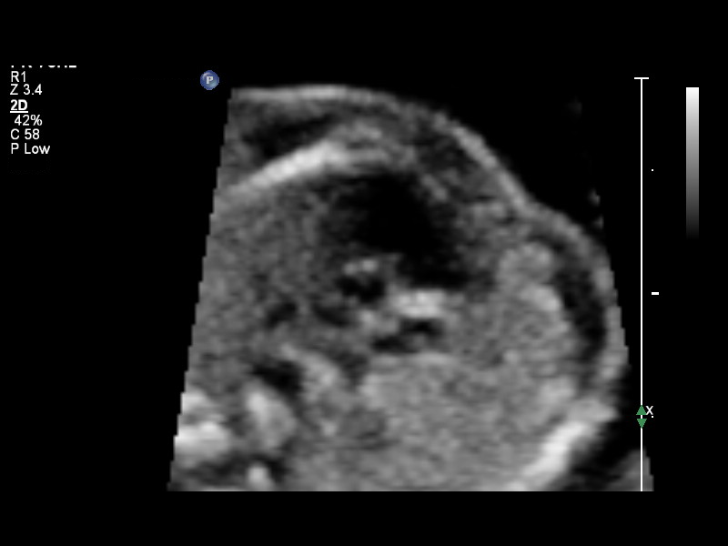
[im 41/73]
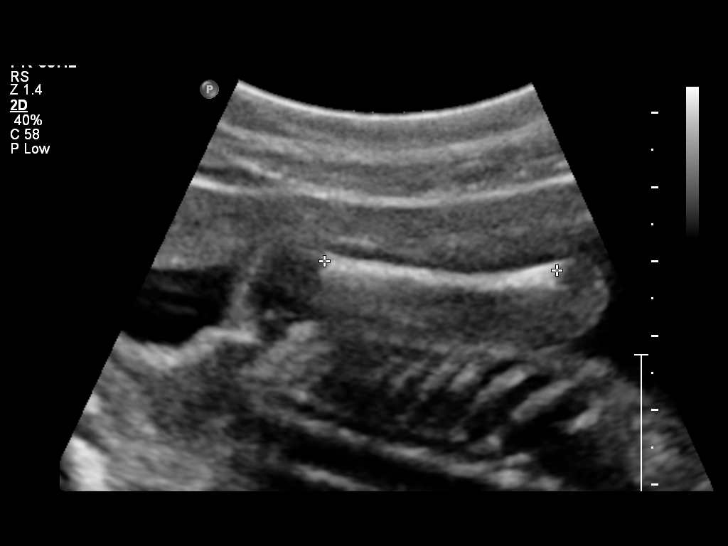
[im 46/73]
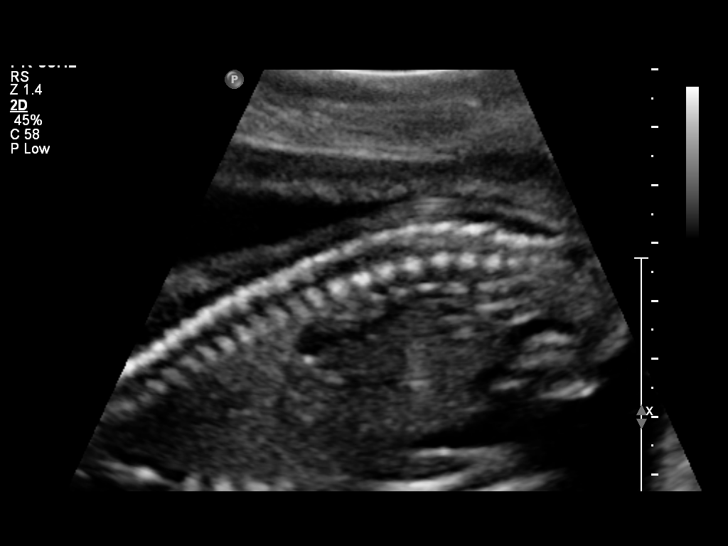
[im 51/73]
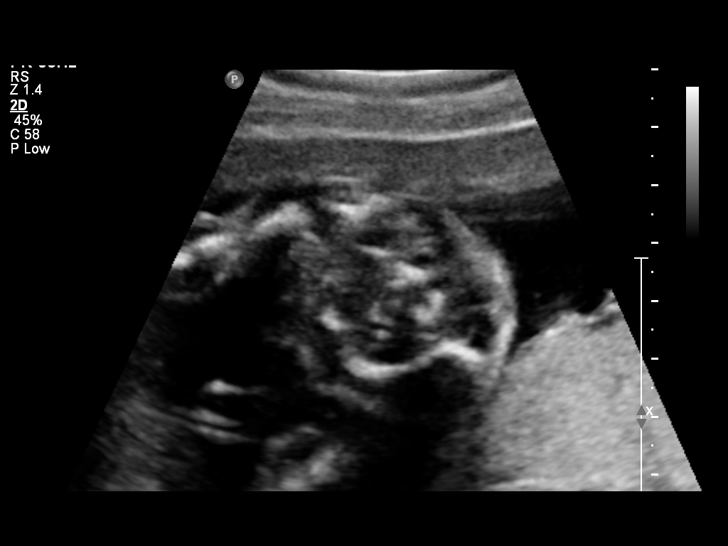
[im 59/73]
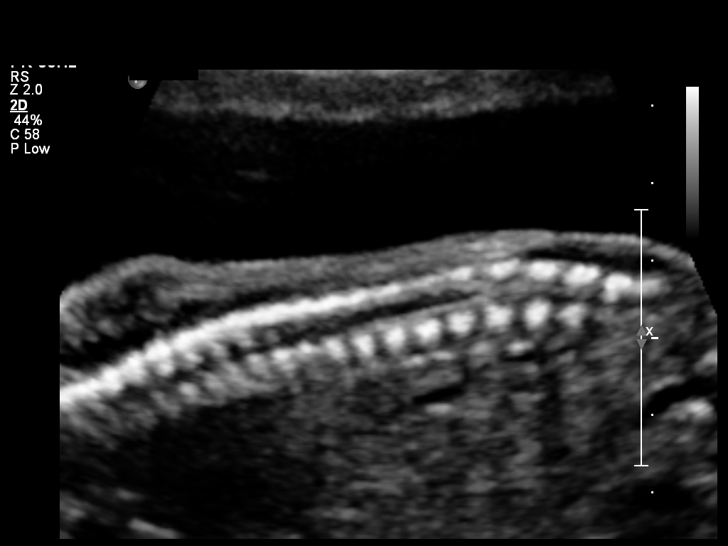
[im 65/73]
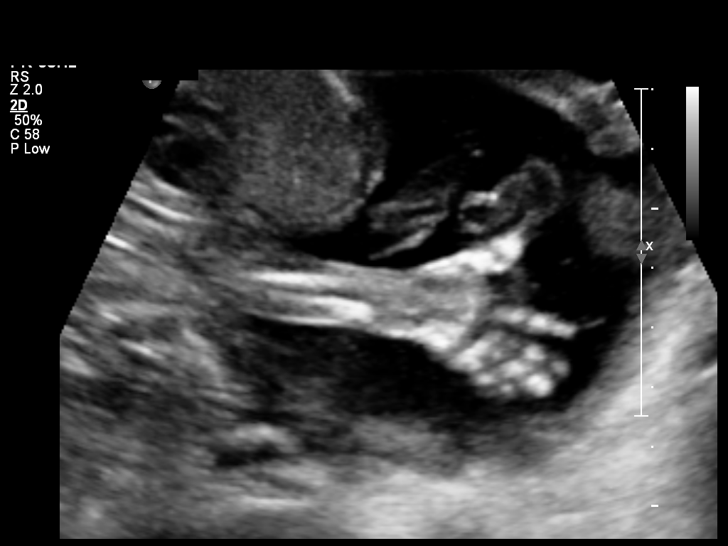
[im 70/73]
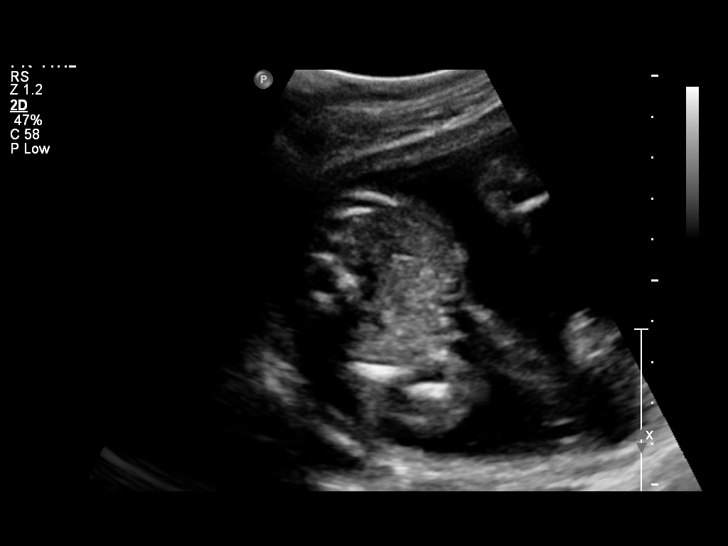

[12 of 28 positions shown; findings below may reference images not displayed]

OBSTETRICS REPORT
                      (Signed Final 12/10/2013 [DATE])

Service(s) Provided

 US OB FOLLOW UP                                       76816.1
Indications

 Follow-up incomplete fetal anatomic evaluation
Fetal Evaluation

 Num Of Fetuses:    1
 Fetal Heart Rate:  163                          bpm
 Cardiac Activity:  Observed
 Presentation:      Breech, footling
 Placenta:          Posterior Fundal, above
                    cervical os
 P. Cord            Visualized, central
 Insertion:

 Amniotic Fluid
 AFI FV:      Subjectively within normal limits
                                             Larg Pckt:    4.73  cm
Biometry

 BPD:     42.7  mm     G. Age:  18w 6d                CI:        62.91   70 - 86
                                                      FL/HC:      18.7   16.8 -

 HC:     173.6  mm     G. Age:  19w 6d       25  %    HC/AC:      1.25   1.09 -

 AC:     138.8  mm     G. Age:  19w 2d       16  %    FL/BPD:
 FL:      32.5  mm     G. Age:  20w 1d       37  %    FL/AC:      23.4   20 - 24
 HUM:     30.7  mm     G. Age:  20w 1d       48  %
 CER:     19.6  mm     G. Age:  18w 6d       12  %

 Est. FW:     307  gm    0 lb 11 oz      35  %
Gestational Age

 LMP:           24w 4d        Date:  06/21/13                 EDD:   03/28/14
 U/S Today:     19w 4d                                        EDD:   05/02/14
 Best:          20w 2d     Det. By:  U/S (11/12/13)           EDD:   04/27/14
Anatomy

 Cranium:          Appears normal         Aortic Arch:      Appears normal
 Fetal Cavum:      Appears normal         Ductal Arch:      Appears normal
 Ventricles:       Appears normal         Diaphragm:        Appears normal
 Choroid Plexus:   Appears normal         Stomach:          Appears normal
 Cerebellum:       Appears normal         Abdomen:          Appears normal
 Posterior Fossa:  Appears normal         Abdominal Wall:   Appears nml (cord
                                                            insert, abd wall)
 Nuchal Fold:      Not applicable (>20    Cord Vessels:     Appears normal (3
                   wks GA)                                  vessel cord)
 Face:             Orbits appear          Kidneys:          Appear normal
                   normal
 Lips:             Appears normal         Bladder:          Appears normal
 Heart:            Appears normal; EIF    Spine:            Appears normal
 RVOT:             Appears normal         Lower             Previously seen
                                          Extremities:
 LVOT:             Appears normal         Upper             Previously seen
                                          Extremities:

 Other:  Gender not well visualized. 5th digit and heels visualized. Technically
         difficult due to fetal position.
Targeted Anatomy

 Fetal Central Nervous System
 Cisterna Magna:
Cervix Uterus Adnexa

 Cervical Length:    2.89     cm

 Cervix:       Normal appearance by transabdominal scan.
 Uterus:       No abnormality visualized.
 Cul De Sac:   No free fluid seen.
 Left Ovary:    Within normal limits.
 Right Ovary:   Not visualized.
 Adnexa:     No abnormality visualized.
Impression

 IUP at 20+2 weeks
 Normal detailed fetal anatomy
 Markers of aneuploidy: EIF; not thought to increase DSR in
 low risk population
 Normal amniotic fluid volume
 Measurements consistent with prior US
Recommendations

 Follow-up as clinically indicated

 questions or concerns.

## 2014-08-23 ENCOUNTER — Ambulatory Visit: Payer: Medicaid Other

## 2014-08-26 ENCOUNTER — Ambulatory Visit (INDEPENDENT_AMBULATORY_CARE_PROVIDER_SITE_OTHER): Payer: Medicaid Other

## 2014-08-26 VITALS — BP 103/71 | HR 72 | Wt 94.2 lb

## 2014-08-26 DIAGNOSIS — Z3009 Encounter for other general counseling and advice on contraception: Secondary | ICD-10-CM

## 2014-08-26 DIAGNOSIS — Z3049 Encounter for surveillance of other contraceptives: Secondary | ICD-10-CM

## 2014-08-26 DIAGNOSIS — Z30011 Encounter for initial prescription of contraceptive pills: Secondary | ICD-10-CM

## 2014-08-26 MED ORDER — NORGESTIM-ETH ESTRAD TRIPHASIC 0.18/0.215/0.25 MG-35 MCG PO TABS: 1.0000 | ORAL_TABLET | Freq: Every day | ORAL | Status: DC

## 2014-08-26 NOTE — Progress Notes (Signed)
Pt came in for a depo provera injection.  Pt c/o "bleeding all the time".  Pt stated that she has only had one injection and the last time she received the injection she had swelling underneath her armpit on her side right side.  Then pt stated that the swelling and pain moved to her right side.  Pt indicated that she really does not want to get the depo.  Per Dr. Debroah Loop, pt can have BCP's until she can schedule an appt to see a provider.  Pt stated understanding.

## 2014-10-11 IMAGING — US US OB FOLLOW-UP
2 series · 12 of 28 positions shown · non-contrast
Comparison: none

OBSTETRICS REPORT
                      (Signed Final 04/04/2014 [DATE])

Service(s) Provided
 US OB FOLLOW UP                                       76816.1
 US UA DOPPLER RE-EVAL                                 76828.1
Indications
 Size less than dates (Small for gestational [AGE]
 FGR)
Fetal Evaluation
 Num Of Fetuses:    1
 Fetal Heart Rate:  161                          bpm
 Cardiac Activity:  Observed
 Presentation:      Cephalic
 Placenta:          Left lateral, above cervical
                    os
 P. Cord            Previously Visualized
 Insertion:
 Amniotic Fluid
 AFI FV:      Subjectively within normal limits
 AFI Sum:     16.06   cm       60  %Tile     Larg Pckt:     5.1  cm
 RUQ:   2.97    cm   RLQ:    4.89   cm    LUQ:   3.1     cm   LLQ:    5.1    cm
Biophysical Evaluation
 Amniotic F.V:   Pocket => 2 cm two         F. Tone:        Observed
                 planes
 F. Movement:    Observed                   Score:          [DATE]
 F. Breathing:   Not Observed
Biometry
 BPD:     87.9  mm     G. Age:  35w 4d                CI:        77.18   70 - 86
                                                      FL/HC:      22.0   20.8 -
 HC:     316.8  mm     G. Age:  35w 4d        8  %    HC/AC:      1.07   0.92 -
 AC:     296.2  mm     G. Age:  33w 4d      < 3  %    FL/BPD:     79.2   71 - 87
 FL:      69.6  mm     G. Age:  35w 5d       24  %    FL/AC:      23.5   20 - 24
 Est. FW:    6053  gm      5 lb 7 oz     21  %
Gestational Age
 LMP:           41w 0d        Date:  06/21/13                 EDD:   03/28/14
 U/S Today:     35w 1d                                        EDD:   05/08/14
 Best:          36w 5d     Det. By:  U/S (11/12/13)           EDD:   04/27/14
Anatomy
 Cranium:          Appears normal         Aortic Arch:      Previously seen
 Fetal Cavum:      Previously seen        Ductal Arch:      Previously seen
 Ventricles:       Appears normal         Diaphragm:        Appears normal
 Choroid Plexus:   Previously seen        Stomach:          Appears normal, left
                                                            sided
 Cerebellum:       Previously seen        Abdomen:          Appears normal
 Posterior Fossa:  Previously seen        Abdominal Wall:   Previously seen
 Nuchal Fold:      Not applicable (>20    Cord Vessels:     Previously seen
                   wks GA)
 Face:             Orbits and profile     Kidneys:          Appear normal
                   previously seen
 Lips:             Previously seen        Bladder:          Appears normal
 Heart:            Echogenic focus        Spine:            Previously seen
                   in LV
 RVOT:             Previously seen        Lower             Previously seen
                                          Extremities:
 LVOT:             Previously seen        Upper             Previously seen
 Other:  Fetus appears to be a female. Technically difficult due to advanced
         GA and fetal position.
Doppler - Fetal Vessels
 Umbilical Artery
 S/D:   3              82  %tile
 Absent DFV:    No     Reverse DFV:    No
Cervix Uterus Adnexa
 Cervix:       Not visualized (advanced GA >34 wks)
 Adnexa:     No abnormality visualized.
Impression
INDICATION: 22 yr old G1P0 at 81w7d for with lagging fetal
 growth for follow up. Remote read.

[Series 1: us ob follow up · 14 acquisitions, 3 frames shown (1 of 2)]
[im 3/14]
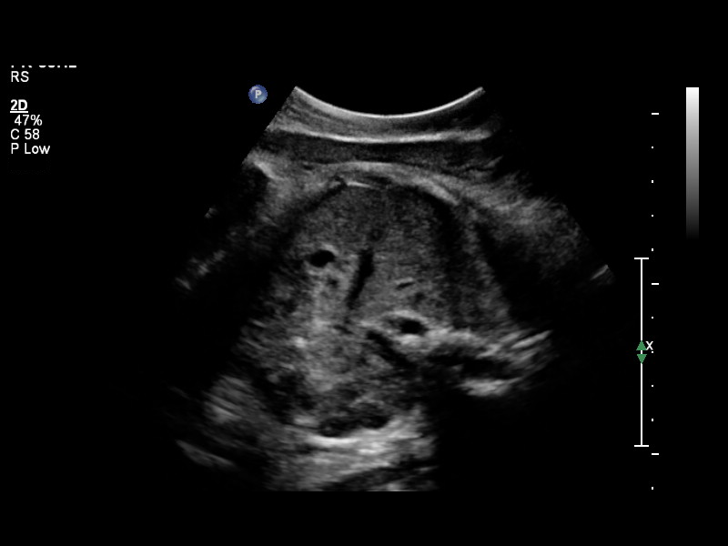
[im 7/14]
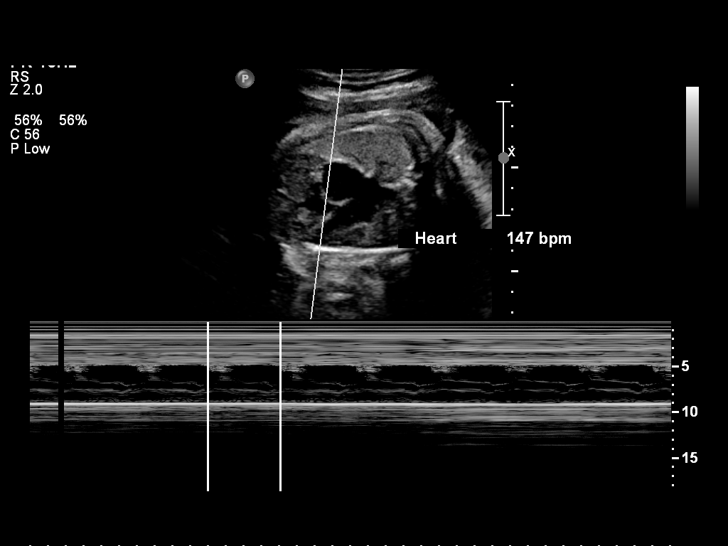
[im 11/14]
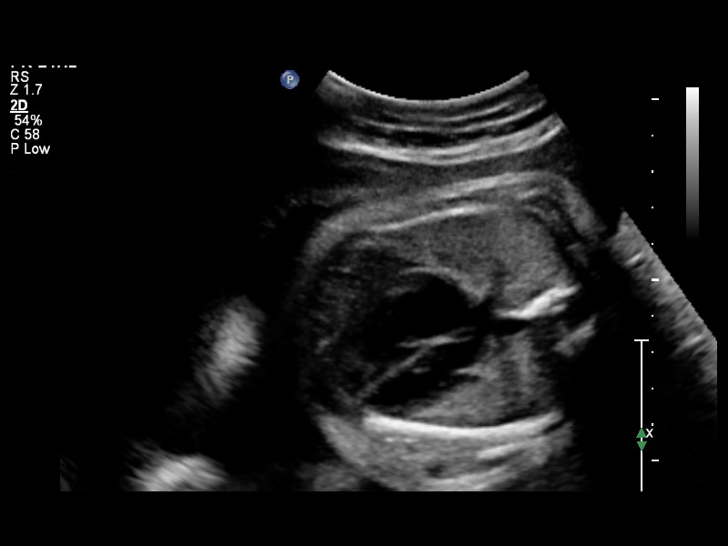

[Series 1: us ob follow up · 43 acquisitions, 9 frames shown (2 of 2)]
[im 3/43]
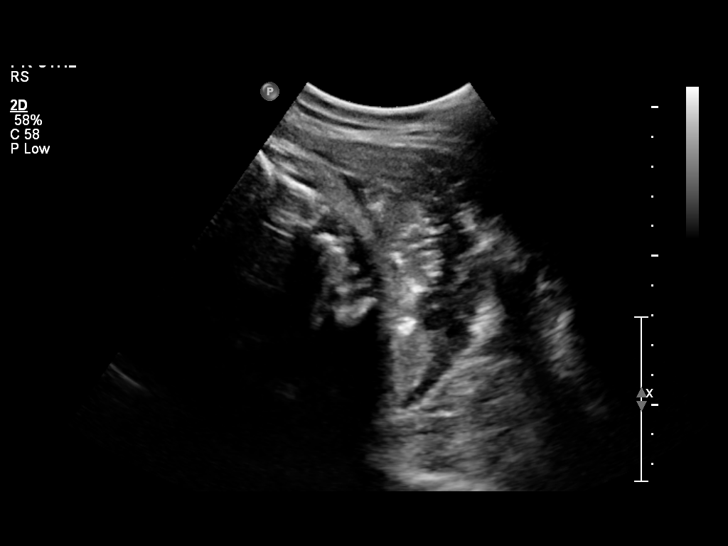
[im 7/43]
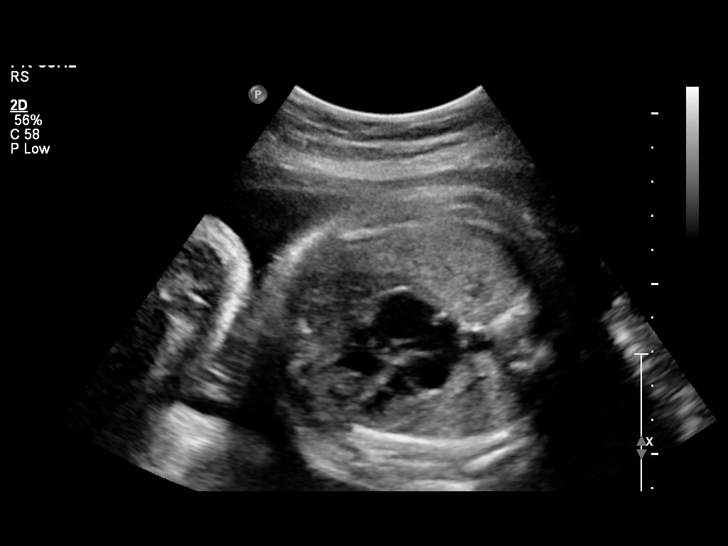
[im 11/43]
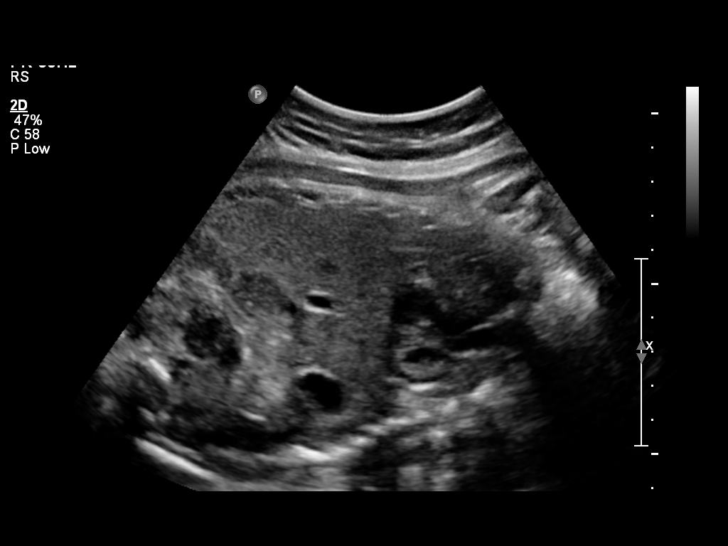
[im 17/43]
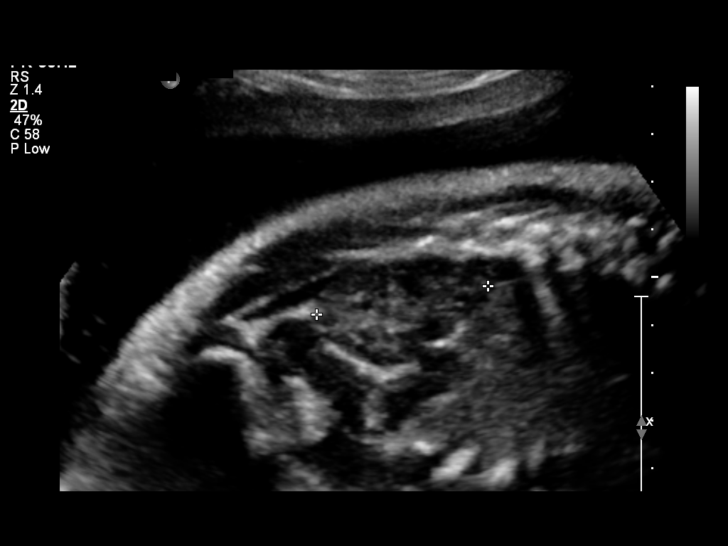
[im 22/43]
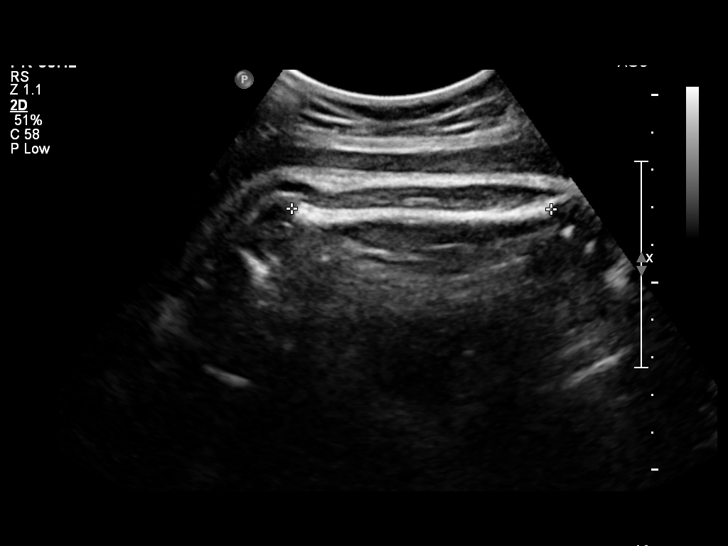
[im 26/43]
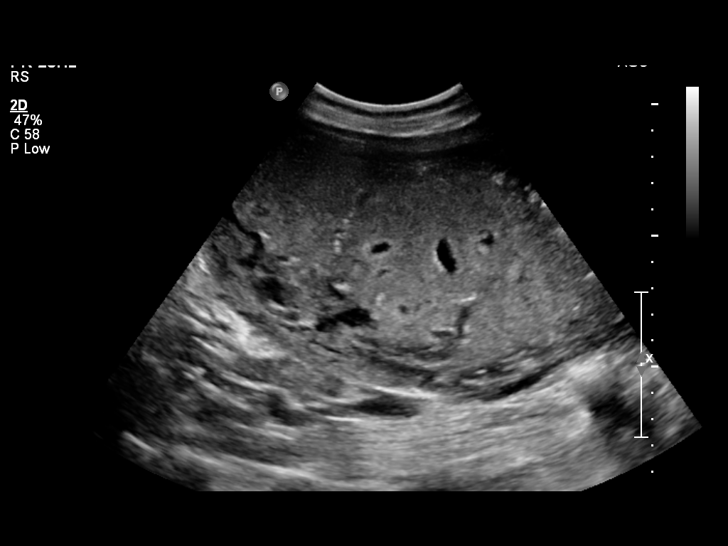
[im 32/43]
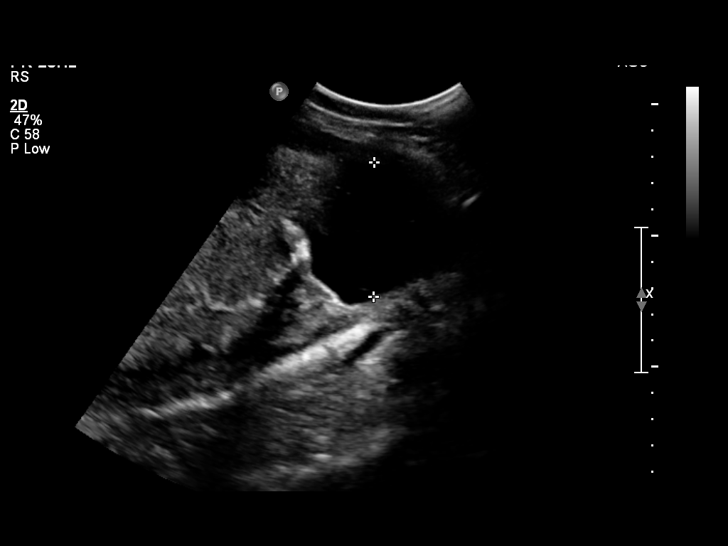
[im 36/43]
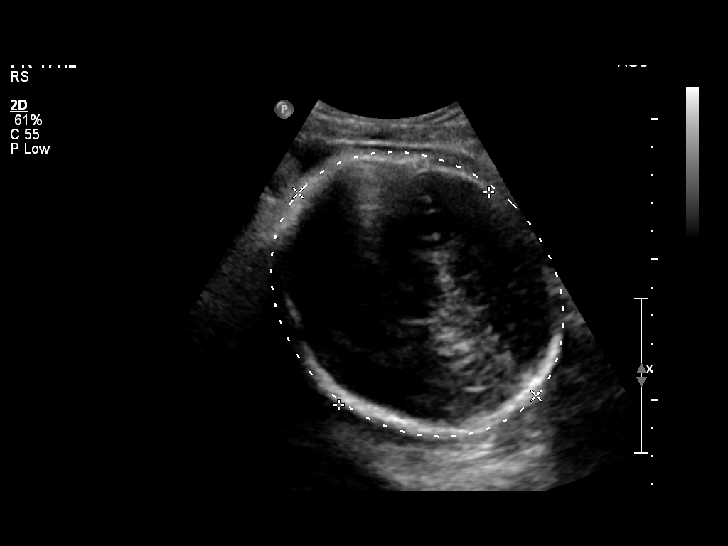
[im 40/43]
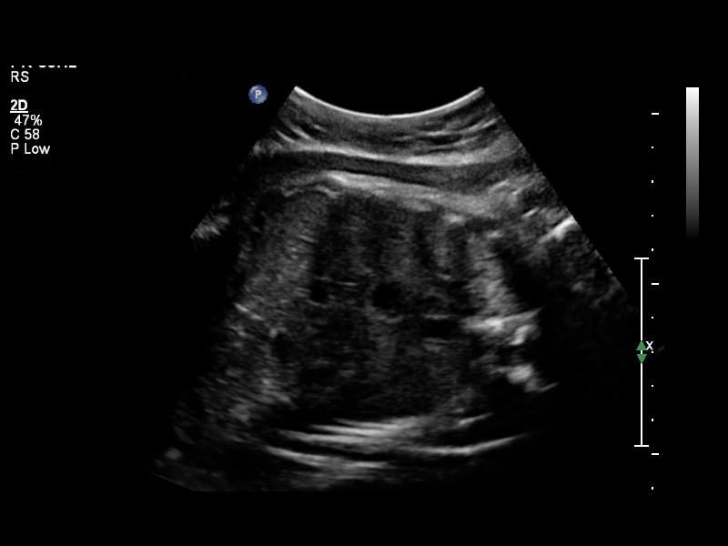

[12 of 28 positions shown; findings below may reference images not displayed]

FINDINGS: 1. Single intrauterine pregnancy.
 2. Estimated fetal weight is in the 21st%. The abdominal
 circumference is in the <3rd%.
 3. Left lateral placenta without evidence of previa.
 4. Normal amniotic fluid index.
 5. The limited anatomy survey is normal.
 6. Normal umbilical artery Doppler studies.
 7. Again seen is an echogenic focus in the left ventricle.
 8. Biophysical profile of [DATE] (-2 for breathing).
Recommendations

 1. Lagging abdominal circumference:
 - <3rd% therefore meets criteria for growth restriction
 - recommend antenatal testing with twice weekly nonstress
 tests, weekly AFI, and weekly Doppler studies
 - BPP [DATE] today; spoke with Dr. Dasilvamiguel and patient
 was sent to their office for an NST
 - if remains uncomplicated (normal AFI, Doppler studies, and
 normal testing) recommend delivery at 38-39 weeks or
 sooner if clinically indicated
 2. Normal amniotic fluid index and Doppler studies today
 3. Echogenic focus in the left ventricle:
 - previously counseled

 questions or concerns.

## 2014-10-24 ENCOUNTER — Encounter: Payer: Self-pay | Admitting: Medical

## 2014-11-21 ENCOUNTER — Ambulatory Visit: Payer: Medicaid Other

## 2016-10-28 ENCOUNTER — Encounter (HOSPITAL_COMMUNITY): Payer: Self-pay | Admitting: Emergency Medicine

## 2016-10-28 ENCOUNTER — Ambulatory Visit (HOSPITAL_COMMUNITY)
Admission: EM | Admit: 2016-10-28 | Discharge: 2016-10-28 | Disposition: A | Discharge status: Home / Self Care | Payer: Medicaid Other | Attending: Internal Medicine | Admitting: Internal Medicine

## 2016-10-28 DIAGNOSIS — L299 Pruritus, unspecified: Secondary | ICD-10-CM

## 2016-10-28 DIAGNOSIS — L219 Seborrheic dermatitis, unspecified: Secondary | ICD-10-CM

## 2016-10-28 MED ORDER — KETOCONAZOLE 2 % EX SHAM: 1.0000 "application " | MEDICATED_SHAMPOO | CUTANEOUS | 0 refills | Status: DC

## 2016-10-28 MED ORDER — SALICYLIC ACID 3 % EX LIQD: CUTANEOUS | 0 refills | Status: DC

## 2016-10-28 NOTE — ED Provider Notes (Signed)
CSN: [id number]     Arrival date & time 10/28/16  1009 History   First MD Initiated Contact with Patient 10/28/16 1142     Chief Complaint  Patient presents with  . Skin Problem   (Consider location/radiation/quality/duration/timing/severity/associated sxs/prior Treatment) HPI Brooke Bush is a 25 y.o. female presenting to UC with c/o scalp itching for about 1 year but now over the last few weeks she has had itching and burning.  She has used OTC head-and-shoulders but no relief.  She notes she has used hair dye in the past but not for about 6 months.  She has noticed a small area of dry skin on the front of her scalp as well as little bumps in her scalp. She has used oils to help with dryness but it is only temporary.     Past Medical History:  Diagnosis Date  . Medical history non-contributory   . Vaginal Pap smear, abnormal    Past Surgical History:  Procedure Laterality Date  . NO PAST SURGERIES     No family history on file. Social History  Substance Use Topics  . Smoking status: Never Smoker  . Smokeless tobacco: Never Used  . Alcohol use No   OB History    Gravida Para Term Preterm AB Living   1 1 1     1    SAB TAB Ectopic Multiple Live Births           1     Review of Systems  Constitutional: Negative for chills and fever.  Skin: Positive for rash. Negative for color change and wound.    Allergies  Patient has no known allergies.  Home Medications   Prior to Admission medications   Medication Sig Start Date End Date Taking? Authorizing Provider  ketoconazole (NIZORAL) 2 % shampoo Apply 1 application topically 2 (two) times a week. For 2-4 weeks 10/28/16   Junius FinnerErin O'Malley, PA-C  Norgestimate-Ethinyl Estradiol Triphasic (TRI-SPRINTEC) 0.18/0.215/0.25 MG-35 MCG tablet Take 1 tablet by mouth daily. Patient not taking: Reported on 10/28/2016 08/26/14   Adam PhenixJames G Arnold, MD  Salicylic Acid 3 % LIQD Apply to scalp 2-3 times daily for itching 10/28/16   Junius FinnerErin O'Malley, PA-C    Meds Ordered and Administered this Visit  Medications - No data to display  BP 114/74 (BP Location: Left Arm) Comment (BP Location): small cuff  Pulse 86   Temp 98.3 F (36.8 C) (Oral)   Resp 14   LMP 09/27/2016   SpO2 100%  No data found.   Physical Exam  Constitutional: She is oriented to person, place, and time. She appears well-developed and well-nourished. No distress.  HENT:  Head: Normocephalic and atraumatic.  Eyes: EOM are normal.  Neck: Normal range of motion.  Cardiovascular: Normal rate.   Pulmonary/Chest: Effort normal.  Musculoskeletal: Normal range of motion.  Neurological: She is alert and oriented to person, place, and time.  Skin: Skin is warm and dry. She is not diaphoretic. There is erythema.  Scalp: faint erythema and small areas of dried skin throughout scalp. No areas of induration or edema.  Psychiatric: She has a normal mood and affect. Her behavior is normal.  Nursing note and vitals reviewed.   Urgent Care Course   Clinical Course     Procedures (including critical care time)  Labs Review Labs Reviewed - No data to display  Imaging Review No results found.   MDM   1. Scalp itch   2. Seborrheic dermatitis of scalp  Pt c/o itching and burning to her scalp. Mild erythema and dried skin noted on exam. Will treat for seborrheic dermatitis of scalp.  Rx: Ketoconazole shampoo and scalpicin for itch.  Encouraged f/u with PCP in 1 week if not improving. Encouraged not to use hair dyes at least until symptoms resolve.  Patient verbalized understanding and agreement with treatment plan.    Junius Finnerrin O'Malley, PA-C 10/28/16 1927

## 2016-10-28 NOTE — ED Triage Notes (Signed)
Itchy scalp for a year.  Now scalp is itching and painful

## 2017-04-30 ENCOUNTER — Ambulatory Visit (HOSPITAL_COMMUNITY)
Admission: EM | Admit: 2017-04-30 | Discharge: 2017-04-30 | Disposition: A | Discharge status: Home / Self Care | Payer: Medicaid Other | Attending: Internal Medicine | Admitting: Internal Medicine

## 2017-04-30 ENCOUNTER — Encounter (HOSPITAL_COMMUNITY): Payer: Self-pay | Admitting: Family Medicine

## 2017-04-30 DIAGNOSIS — R102 Pelvic and perineal pain: Secondary | ICD-10-CM | POA: Insufficient documentation

## 2017-04-30 DIAGNOSIS — R109 Unspecified abdominal pain: Secondary | ICD-10-CM | POA: Diagnosis not present

## 2017-04-30 DIAGNOSIS — R300 Dysuria: Secondary | ICD-10-CM | POA: Diagnosis not present

## 2017-04-30 DIAGNOSIS — K2960 Other gastritis without bleeding: Secondary | ICD-10-CM | POA: Diagnosis not present

## 2017-04-30 DIAGNOSIS — R1030 Lower abdominal pain, unspecified: Secondary | ICD-10-CM | POA: Diagnosis present

## 2017-04-30 DIAGNOSIS — Z3202 Encounter for pregnancy test, result negative: Secondary | ICD-10-CM

## 2017-04-30 LAB — POCT URINALYSIS DIP (DEVICE)
Bilirubin Urine: NEGATIVE
Glucose, UA: NEGATIVE mg/dL
Ketones, ur: NEGATIVE mg/dL
LEUKOCYTES UA: NEGATIVE
Nitrite: NEGATIVE
Protein, ur: NEGATIVE mg/dL
SPECIFIC GRAVITY, URINE: 1.02 (ref 1.005–1.030)
Urobilinogen, UA: 0.2 mg/dL (ref 0.0–1.0)
pH: 7 (ref 5.0–8.0)

## 2017-04-30 LAB — POCT PREGNANCY, URINE: Preg Test, Ur: NEGATIVE

## 2017-04-30 MED ORDER — OMEPRAZOLE 40 MG PO CPDR: 40.0000 mg | DELAYED_RELEASE_CAPSULE | Freq: Two times a day (BID) | ORAL | 0 refills | Status: DC

## 2017-04-30 MED ORDER — CEFTRIAXONE SODIUM 250 MG IJ SOLR
INTRAMUSCULAR | Status: AC
  Filled 2017-04-30: qty 250

## 2017-04-30 MED ORDER — AZITHROMYCIN 250 MG PO TABS
ORAL_TABLET | ORAL | Status: AC
  Filled 2017-04-30: qty 4

## 2017-04-30 MED ORDER — CEFTRIAXONE SODIUM 250 MG IJ SOLR
250.0000 mg | Freq: Once | INTRAMUSCULAR | Status: AC
  Administered 2017-04-30: 250 mg via INTRAMUSCULAR

## 2017-04-30 MED ORDER — AZITHROMYCIN 250 MG PO TABS
1000.0000 mg | ORAL_TABLET | Freq: Once | ORAL | Status: AC
  Administered 2017-04-30: 1000 mg via ORAL

## 2017-04-30 NOTE — Discharge Instructions (Addendum)
Pres[facility]ide [facility] [address] [place]   Urine and vaginal swab tests are pending for common causes of pelvic pain.  Pregnancy test was negative.   Injection of rocephin and oral dose of zithromax were given today.  The urgent care will notify you if further treatment is needed.

## 2017-04-30 NOTE — ED Triage Notes (Signed)
Pt here for lower abd pain with urinary frequency, and dysuria.

## 2017-05-01 LAB — URINE CULTURE: Culture: NO GROWTH

## 2017-05-01 LAB — CERVICOVAGINAL ANCILLARY ONLY
Bacterial vaginitis: NEGATIVE
Candida vaginitis: NEGATIVE
Chlamydia: NEGATIVE
Neisseria Gonorrhea: NEGATIVE
TRICH (WINDOWPATH): NEGATIVE

## 2017-05-04 NOTE — ED Provider Notes (Signed)
[facility]    CSN: [id number] Arrival date & time: 04/30/17  1238     History   Chief Complaint Chief Complaint  Patient presents with  . Dysuria  . Abdominal Pain    HPI Brooke Bush is a 26 y.o. female. Presents today with crampy intermittent mild lower abd discomfort, and urinary frequency/dysuria.  No fever, no malaise.  No change in bowels.  No unusual vag bleeding/discharge.  Some upper/epigastric abdominal discomfort.    HPI  Past Medical History:  Diagnosis Date  . Medical history non-contributory   . Vaginal Pap smear, abnormal     Patient Active Problem List   Diagnosis Date Noted  . NSVD (normal spontaneous vaginal delivery) 04/21/2014  . PHARYNGITIS 07/18/2008  . MENORRHALGIA 07/18/2008    Past Surgical History:  Procedure Laterality Date  . NO PAST SURGERIES      OB History    Gravida Para Term Preterm AB Living   1 1 1     1    SAB TAB Ectopic Multiple Live Births           1       Home Medications    Prior to Admission medications   Medication Sig Start Date End Date Taking? Authorizing Provider  ketoconazole (NIZORAL) 2 % shampoo Apply 1 application topically 2 (two) times a week. For 2-4 weeks 10/28/16   Junius Finner'Malley, Erin, PA-C  omeprazole (PRILOSEC) 40 MG capsule Take 1 capsule (40 mg total) by mouth 2 (two) times daily before a meal. 04/30/17 05/15/17  Eustace MooreMurray, Caesar Mannella W, MD  Salicylic Acid 3 % LIQD Apply to scalp 2-3 times daily for itching 10/28/16   Junius Finner'Malley, Erin, PA-C    Family History History reviewed. No pertinent family history.  Social History Social History  Substance Use Topics  . Smoking status: Never Smoker  . Smokeless tobacco: Never Used  . Alcohol use No     Allergies   Patient has no known allergies.   Review of Systems Review of Systems  All other systems reviewed and are negative.    Physical Exam Triage Vital Signs ED Triage Vitals  Enc Vitals Group     BP 04/30/17 1308 (!) 106/58     Pulse Rate  04/30/17 1308 87     Resp 04/30/17 1308 16     Temp 04/30/17 1308 98.4 F (36.9 C)     Temp src --      SpO2 04/30/17 1308 100 %     Weight --      Height --      Head Circumference --      Peak Flow --      Pain Score 04/30/17 1307 6     Pain Loc --      Pain Edu? --      Excl. in GC? --    No data found.   Updated Vital Signs BP (!) 106/58   Pulse 87   Temp 98.4 F (36.9 C)   Resp 16   LMP 04/21/2017   SpO2 100%   Physical Exam  Constitutional: She is oriented to person, place, and time. No distress.  HENT:  Head: Atraumatic.  Eyes:  Conjugate gaze observed, no eye redness/discharge  Neck: Neck supple.  Cardiovascular: Normal rate and regular rhythm.   Pulmonary/Chest: No respiratory distress. She has no wheezes.  Abdominal: Soft. She exhibits no distension. There is no rebound and no guarding.  Mild epigastric tenderness to deep palpation  Genitourinary:  Genitourinary Comments: Bimanual exam without CMT, no adnexal enlargement/tenderness  Musculoskeletal: Normal range of motion.  Neurological: She is alert and oriented to person, place, and time.  Skin: Skin is warm and dry.  Nursing note and vitals reviewed.    UC Treatments / Results  Labs Results for orders placed or performed during the hospital encounter of 04/30/17  Urine culture  Result Value Ref Range   Specimen Description URINE, CLEAN CATCH    Special Requests NONE    Culture NO GROWTH    Report Status 05/01/2017 FINAL   POCT urinalysis dip (device)  Result Value Ref Range   Glucose, UA NEGATIVE NEGATIVE mg/dL   Bilirubin Urine NEGATIVE NEGATIVE   Ketones, ur NEGATIVE NEGATIVE mg/dL   Specific Gravity, Urine 1.020 1.005 - 1.030   Hgb urine dipstick MODERATE (A) NEGATIVE   pH 7.0 5.0 - 8.0   Protein, ur NEGATIVE NEGATIVE mg/dL   Urobilinogen, UA 0.2 0.0 - 1.0 mg/dL   Nitrite NEGATIVE NEGATIVE   Leukocytes, UA NEGATIVE NEGATIVE  Pregnancy, urine POC  Result Value Ref Range   Preg Test,  Ur NEGATIVE NEGATIVE  Cervicovaginal ancillary only  Result Value Ref Range   Bacterial vaginitis Negative for Bacterial Vaginitis Microorganisms    Candida vaginitis Negative for Candida species    Chlamydia Negative    Neisseria gonorrhea Negative    Trichomonas Negative     Procedures Procedures (including critical care time)  Medications Ordered in UC Medications  cefTRIAXone (ROCEPHIN) injection 250 mg (250 mg Intramuscular Given 04/30/17 1457)  azithromycin (ZITHROMAX) tablet 1,000 mg (1,000 mg Oral Given 04/30/17 1457)     Final Clinical Impressions(s) / UC Diagnoses   Final diagnoses:  Reflux gastritis  Pelvic pain in female   Prescription for omeprazole for stomach acid was sent to the [facility] on [address] at [place].   Urine and vaginal swab tests are pending for common causes of pelvic pain.  Pregnancy test was negative.   Injection of rocephin and oral dose of zithromax were given today.  The urgent care will notify you if further treatment is needed.    New Prescriptions Discharge Medication List as of 04/30/2017  2:55 PM    START taking these medications   Details  omeprazole (PRILOSEC) 40 MG capsule Take 1 capsule (40 mg total) by mouth 2 (two) times daily before a meal., Starting Wed 04/30/2017, Until Thu 05/15/2017, Normal         Eustace Moore, MD 05/05/17 [phone]

## 2018-05-17 ENCOUNTER — Encounter (HOSPITAL_COMMUNITY): Payer: Self-pay | Admitting: Emergency Medicine

## 2018-05-17 ENCOUNTER — Ambulatory Visit (HOSPITAL_COMMUNITY)
Admission: EM | Admit: 2018-05-17 | Discharge: 2018-05-17 | Disposition: A | Discharge status: Home / Self Care | Payer: Medicaid Other | Attending: Family Medicine | Admitting: Family Medicine

## 2018-05-17 DIAGNOSIS — R350 Frequency of micturition: Secondary | ICD-10-CM | POA: Diagnosis not present

## 2018-05-17 DIAGNOSIS — M545 Low back pain: Secondary | ICD-10-CM

## 2018-05-17 DIAGNOSIS — R110 Nausea: Secondary | ICD-10-CM

## 2018-05-17 DIAGNOSIS — Z3A08 8 weeks gestation of pregnancy: Secondary | ICD-10-CM

## 2018-05-17 DIAGNOSIS — Z3201 Encounter for pregnancy test, result positive: Secondary | ICD-10-CM | POA: Diagnosis not present

## 2018-05-17 LAB — POCT URINALYSIS DIP (DEVICE)
Bilirubin Urine: NEGATIVE
Glucose, UA: NEGATIVE mg/dL
Ketones, ur: NEGATIVE mg/dL
Leukocytes, UA: NEGATIVE
Nitrite: NEGATIVE
PH: 7 (ref 5.0–8.0)
PROTEIN: NEGATIVE mg/dL
SPECIFIC GRAVITY, URINE: 1.01 (ref 1.005–1.030)
UROBILINOGEN UA: 0.2 mg/dL (ref 0.0–1.0)

## 2018-05-17 LAB — POCT PREGNANCY, URINE: Preg Test, Ur: POSITIVE — AB

## 2018-05-17 MED ORDER — PRENATAL VITAMINS 0.8 MG PO TABS: 1.0000 | ORAL_TABLET | Freq: Every day | ORAL | 0 refills | Status: DC

## 2018-05-17 NOTE — Discharge Instructions (Addendum)
Rest and drink plenty of fluids Based on your last menstrual period you are approximately [redacted] weeks pregnant Make appointment with OB/GYN as soon as possible Prescribed prenatal vitamins Return or go to the ER if you have any new or worsening symptoms

## 2018-05-17 NOTE — ED Provider Notes (Addendum)
[facility]   SUBJECTIVE:  Brooke Bush is a 27 y.o. female who presents for possible pregnancy.  Last unprotected sex end of March, states she was menstruating during that time and since then has not had a cycle.  Admits to increased urinary frequency, breast tenderness, low back pain, and nausea.  Denies weight gain, fever, chills, vaginal pain, pelvic pain, or vaginal bleeding and dysuria.    LMP: No LMP recorded (lmp unknown). End of March ROS: As in HPI.  Past Medical History:  Diagnosis Date  . Medical history non-contributory   . Vaginal Pap smear, abnormal    Past Surgical History:  Procedure Laterality Date  . NO PAST SURGERIES     No Known Allergies No current facility-administered medications on file prior to encounter.    Current Outpatient Medications on File Prior to Encounter  Medication Sig Dispense Refill  . ketoconazole (NIZORAL) 2 % shampoo Apply 1 application topically 2 (two) times a week. For 2-4 weeks (Patient not taking: Reported on 05/17/2018) 120 mL 0  . omeprazole (PRILOSEC) 40 MG capsule Take 1 capsule (40 mg total) by mouth 2 (two) times daily before a meal. (Patient not taking: Reported on 05/17/2018) 30 capsule 0  . Salicylic Acid 3 % LIQD Apply to scalp 2-3 times daily for itching (Patient not taking: Reported on 05/17/2018) 1 Bottle 0   Social History   Socioeconomic History  . Marital status: Single    Spouse name: Not on file  . Number of children: Not on file  . Years of education: Not on file  . Highest education level: Not on file  Occupational History  . Not on file  Social Needs  . Financial resource strain: Not on file  . Food insecurity:    Worry: Not on file    Inability: Not on file  . Transportation needs:    Medical: Not on file    Non-medical: Not on file  Tobacco Use  . Smoking status: Never Smoker  . Smokeless tobacco: Never Used  Substance and Sexual Activity  . Alcohol use: No  . Drug use: No  . Sexual  activity: Yes    Birth control/protection: None  Lifestyle  . Physical activity:    Days per week: Not on file    Minutes per session: Not on file  . Stress: Not on file  Relationships  . Social connections:    Talks on phone: Not on file    Gets together: Not on file    Attends religious service: Not on file    Active member of club or organization: Not on file    Attends meetings of clubs or organizations: Not on file    Relationship status: Not on file  . Intimate partner violence:    Fear of current or ex partner: Not on file    Emotionally abused: Not on file    Physically abused: Not on file    Forced sexual activity: Not on file  Other Topics Concern  . Not on file  Social History Narrative  . Not on file   No family history on file.  OBJECTIVE:  Vitals:   05/17/18 1234  BP: 131/81  Pulse: (!) 107  Resp: 18  Temp: (!) 97.4 F (36.3 C)  SpO2: 99%   General appearance: AOx3 in no acute distress HEENT: NCAT.  Oropharynx clear.  Lungs: clear to auscultation bilaterally without adventitious breath sounds Heart: regular rate and rhythm.  Radial pulses 2+ symmetrical  bilaterally Abdomen: soft; non-distended; no tenderness; bowel sounds present; no guarding Back: no CVA tenderness Extremities: no edema; symmetrical with no gross deformities Skin: warm and dry Neurologic: Ambulates from chair to exam table without difficulty Psychological: alert and cooperative; normal mood and affect  Labs Reviewed  POCT URINALYSIS DIP (DEVICE) - Abnormal; Notable for the following components:      Result Value   Hgb urine dipstick SMALL (*)    All other components within normal limits  POCT PREGNANCY, URINE - Abnormal; Notable for the following components:   Preg Test, Ur POSITIVE (*)    All other components within normal limits    ASSESSMENT & PLAN:  1. [redacted] weeks gestation of pregnancy     Meds ordered this encounter  Medications  . Prenatal Multivit-Min-Fe-FA  (PRENATAL VITAMINS) 0.8 MG tablet    Sig: Take 1 tablet by mouth daily.    Dispense:  30 tablet    Refill:  0    Order Specific Question:   Supervising Provider    Answer:   Isa Rankin [[id number]]    Rest and drink plenty of fluids Based on your last menstrual period you are approximately [redacted] weeks pregnant Make appointment with OB/GYN as soon as possible Prescribed prenatal vitamins Return or go to the ER if you have any new or worsening symptoms   Outlined signs and symptoms indicating need for more acute intervention. Patient verbalized understanding. After Visit Summary given.     Rennis Harding, PA-C 05/17/18 1333    Alvino [place], PA-C 05/17/18 1334

## 2018-05-17 NOTE — ED Triage Notes (Signed)
Pt states shes having lower pelvic pain, buring with urination. Pt states she has irregular periods.

## 2018-06-15 ENCOUNTER — Other Ambulatory Visit (HOSPITAL_COMMUNITY): Payer: Self-pay | Admitting: Family

## 2018-06-15 DIAGNOSIS — Z3A13 13 weeks gestation of pregnancy: Secondary | ICD-10-CM

## 2018-06-15 DIAGNOSIS — Z369 Encounter for antenatal screening, unspecified: Secondary | ICD-10-CM

## 2018-06-15 LAB — OB RESULTS CONSOLE ANTIBODY SCREEN: ANTIBODY SCREEN: NEGATIVE

## 2018-06-15 LAB — OB RESULTS CONSOLE GC/CHLAMYDIA
Chlamydia: NEGATIVE
GC PROBE AMP, GENITAL: NEGATIVE

## 2018-06-15 LAB — OB RESULTS CONSOLE HEPATITIS B SURFACE ANTIGEN: HEP B S AG: NEGATIVE

## 2018-06-15 LAB — OB RESULTS CONSOLE RPR: RPR: NONREACTIVE

## 2018-06-15 LAB — CYTOLOGY - PAP: Pap Smear: NEGATIVE

## 2018-06-15 LAB — OB RESULTS CONSOLE HIV ANTIBODY (ROUTINE TESTING): HIV: NONREACTIVE

## 2018-06-15 LAB — OB RESULTS CONSOLE ABO/RH: RH TYPE: POSITIVE

## 2018-06-15 LAB — OB RESULTS CONSOLE RUBELLA ANTIBODY, IGM: RUBELLA: IMMUNE

## 2018-06-22 ENCOUNTER — Encounter (HOSPITAL_COMMUNITY): Payer: Self-pay | Admitting: *Deleted

## 2018-06-23 ENCOUNTER — Ambulatory Visit (HOSPITAL_COMMUNITY)
Admission: RE | Admit: 2018-06-23 | Discharge: 2018-06-23 | Disposition: A | Discharge status: Home / Self Care | Payer: Medicaid Other | Source: Ambulatory Visit | Attending: Family | Admitting: Family

## 2018-06-23 ENCOUNTER — Other Ambulatory Visit (HOSPITAL_COMMUNITY): Payer: Self-pay | Admitting: Family

## 2018-06-23 ENCOUNTER — Encounter (HOSPITAL_COMMUNITY): Payer: Self-pay

## 2018-06-23 DIAGNOSIS — Z3682 Encounter for antenatal screening for nuchal translucency: Secondary | ICD-10-CM

## 2018-06-23 DIAGNOSIS — Z3A13 13 weeks gestation of pregnancy: Secondary | ICD-10-CM

## 2018-06-23 DIAGNOSIS — Z369 Encounter for antenatal screening, unspecified: Secondary | ICD-10-CM

## 2018-06-23 HISTORY — DX: Gastro-esophageal reflux disease without esophagitis: K21.9

## 2018-07-08 ENCOUNTER — Other Ambulatory Visit: Payer: Self-pay

## 2018-12-01 LAB — OB RESULTS CONSOLE GC/CHLAMYDIA
Chlamydia: NEGATIVE
Gonorrhea: NEGATIVE

## 2018-12-01 LAB — OB RESULTS CONSOLE GBS: STREP GROUP B AG: NEGATIVE

## 2018-12-23 NOTE — L&D Delivery Note (Addendum)
Delivery Note At 8:14 PM a viable female was delivered via Vaginal, Spontaneous (Presentation: OA).  APGAR: 8, 9; weight: pending.   Placenta status: delivered spontaneously, intact. Cord: 3-vessel with the following complications: none.  Cord pH: N/A  Anesthesia: Epidural Episiotomy: None Lacerations: 2nd degree Suture Repair: 3.0 vicryl Est. Blood Loss (mL): 324  Mom to postpartum.  Baby to Couplet care / Skin to Skin.  [name] 12/31/2018, 8:58 PM  Patient progressed through labor smoothly with Epidural. Mom pushed for approximately 20 minutes, baby presented OA and was delivered shortly after presentation. Patient underwent repair of perineal laceration with 3.0 vicryl without use of lidocaine. Patient tolerated repair and clean up without trouble.  The above was performed under my direct supervision and guidance.

## 2018-12-24 ENCOUNTER — Telehealth (HOSPITAL_COMMUNITY): Payer: Self-pay | Admitting: *Deleted

## 2018-12-24 ENCOUNTER — Encounter (HOSPITAL_COMMUNITY): Payer: Self-pay | Admitting: *Deleted

## 2018-12-24 NOTE — Telephone Encounter (Signed)
Preadmission screen  

## 2018-12-27 ENCOUNTER — Other Ambulatory Visit: Payer: Self-pay | Admitting: Family Medicine

## 2018-12-31 ENCOUNTER — Inpatient Hospital Stay (HOSPITAL_COMMUNITY): Payer: Medicaid Other | Admitting: Anesthesiology

## 2018-12-31 ENCOUNTER — Inpatient Hospital Stay (HOSPITAL_COMMUNITY)
Admission: RE | Admit: 2018-12-31 | Discharge: 2019-01-02 | DRG: 807 | Disposition: A | Discharge status: Home / Self Care | Payer: Medicaid Other | Attending: Obstetrics and Gynecology | Admitting: Obstetrics and Gynecology

## 2018-12-31 ENCOUNTER — Encounter (HOSPITAL_COMMUNITY): Payer: Self-pay

## 2018-12-31 ENCOUNTER — Other Ambulatory Visit: Payer: Self-pay

## 2018-12-31 DIAGNOSIS — Z3A41 41 weeks gestation of pregnancy: Secondary | ICD-10-CM

## 2018-12-31 DIAGNOSIS — O480 Post-term pregnancy: Principal | ICD-10-CM | POA: Diagnosis present

## 2018-12-31 LAB — TYPE AND SCREEN
ABO/RH(D): O POS
ANTIBODY SCREEN: NEGATIVE

## 2018-12-31 LAB — CBC
HCT: 37.6 % (ref 36.0–46.0)
Hemoglobin: 12.8 g/dL (ref 12.0–15.0)
MCH: 26.6 pg (ref 26.0–34.0)
MCHC: 34 g/dL (ref 30.0–36.0)
MCV: 78.2 fL — ABNORMAL LOW (ref 80.0–100.0)
PLATELETS: 307 10*3/uL (ref 150–400)
RBC: 4.81 MIL/uL (ref 3.87–5.11)
RDW: 14.1 % (ref 11.5–15.5)
WBC: 14.8 10*3/uL — ABNORMAL HIGH (ref 4.0–10.5)
nRBC: 0 % (ref 0.0–0.2)

## 2018-12-31 LAB — RPR: RPR Ser Ql: NONREACTIVE

## 2018-12-31 MED ORDER — PHENYLEPHRINE 40 MCG/ML (10ML) SYRINGE FOR IV PUSH (FOR BLOOD PRESSURE SUPPORT)
PREFILLED_SYRINGE | INTRAVENOUS | Status: AC
  Filled 2018-12-31: qty 10

## 2018-12-31 MED ORDER — MISOPROSTOL 25 MCG QUARTER TABLET
25.0000 ug | ORAL_TABLET | ORAL | Status: DC | PRN
  Administered 2018-12-31: 25 ug via VAGINAL
  Filled 2018-12-31 (×2): qty 1

## 2018-12-31 MED ORDER — WITCH HAZEL-GLYCERIN EX PADS: 1.0000 "application " | MEDICATED_PAD | CUTANEOUS | Status: DC | PRN

## 2018-12-31 MED ORDER — DIPHENHYDRAMINE HCL 25 MG PO CAPS: 25.0000 mg | ORAL_CAPSULE | Freq: Four times a day (QID) | ORAL | Status: DC | PRN

## 2018-12-31 MED ORDER — BENZOCAINE-MENTHOL 20-0.5 % EX AERO
1.0000 "application " | INHALATION_SPRAY | CUTANEOUS | Status: DC | PRN
  Filled 2018-12-31: qty 56

## 2018-12-31 MED ORDER — SIMETHICONE 80 MG PO CHEW: 80.0000 mg | CHEWABLE_TABLET | ORAL | Status: DC | PRN

## 2018-12-31 MED ORDER — ONDANSETRON HCL 4 MG PO TABS: 4.0000 mg | ORAL_TABLET | ORAL | Status: DC | PRN

## 2018-12-31 MED ORDER — IBUPROFEN 600 MG PO TABS
600.0000 mg | ORAL_TABLET | Freq: Four times a day (QID) | ORAL | Status: DC
  Administered 2018-12-31 – 2019-01-02 (×7): 600 mg via ORAL
  Filled 2018-12-31 (×5): qty 1

## 2018-12-31 MED ORDER — EPHEDRINE 5 MG/ML INJ
10.0000 mg | INTRAVENOUS | Status: DC | PRN
  Filled 2018-12-31: qty 2

## 2018-12-31 MED ORDER — LACTATED RINGERS IV SOLN
INTRAVENOUS | Status: DC
  Administered 2018-12-31 (×2): via INTRAVENOUS

## 2018-12-31 MED ORDER — PRENATAL MULTIVITAMIN CH
1.0000 | ORAL_TABLET | Freq: Every day | ORAL | Status: DC
  Administered 2019-01-01 – 2019-01-02 (×2): 1 via ORAL
  Filled 2018-12-31 (×2): qty 1

## 2018-12-31 MED ORDER — OXYTOCIN BOLUS FROM INFUSION
500.0000 mL | Freq: Once | INTRAVENOUS | Status: AC
  Administered 2018-12-31: 500 mL via INTRAVENOUS

## 2018-12-31 MED ORDER — TETANUS-DIPHTH-ACELL PERTUSSIS 5-2.5-18.5 LF-MCG/0.5 IM SUSP: 0.5000 mL | Freq: Once | INTRAMUSCULAR | Status: DC

## 2018-12-31 MED ORDER — ONDANSETRON HCL 4 MG/2ML IJ SOLN: 4.0000 mg | Freq: Four times a day (QID) | INTRAMUSCULAR | Status: DC | PRN

## 2018-12-31 MED ORDER — SENNOSIDES-DOCUSATE SODIUM 8.6-50 MG PO TABS
2.0000 | ORAL_TABLET | ORAL | Status: DC
  Administered 2019-01-01: 2 via ORAL
  Filled 2018-12-31: qty 2

## 2018-12-31 MED ORDER — SOD CITRATE-CITRIC ACID 500-334 MG/5ML PO SOLN: 30.0000 mL | ORAL | Status: DC | PRN

## 2018-12-31 MED ORDER — PHENYLEPHRINE 40 MCG/ML (10ML) SYRINGE FOR IV PUSH (FOR BLOOD PRESSURE SUPPORT)
80.0000 ug | PREFILLED_SYRINGE | INTRAVENOUS | Status: DC | PRN
  Filled 2018-12-31: qty 10

## 2018-12-31 MED ORDER — FENTANYL 2.5 MCG/ML BUPIVACAINE 1/10 % EPIDURAL INFUSION (WH - ANES)
INTRAMUSCULAR | Status: AC
  Filled 2018-12-31: qty 100

## 2018-12-31 MED ORDER — DIBUCAINE 1 % RE OINT: 1.0000 "application " | TOPICAL_OINTMENT | RECTAL | Status: DC | PRN

## 2018-12-31 MED ORDER — TERBUTALINE SULFATE 1 MG/ML IJ SOLN
0.2500 mg | Freq: Once | INTRAMUSCULAR | Status: DC | PRN
  Filled 2018-12-31: qty 1

## 2018-12-31 MED ORDER — LIDOCAINE HCL (PF) 1 % IJ SOLN
30.0000 mL | INTRAMUSCULAR | Status: DC | PRN
  Administered 2018-12-31: 30 mL via SUBCUTANEOUS
  Filled 2018-12-31: qty 30

## 2018-12-31 MED ORDER — FLEET ENEMA 7-19 GM/118ML RE ENEM: 1.0000 | ENEMA | RECTAL | Status: DC | PRN

## 2018-12-31 MED ORDER — ACETAMINOPHEN 325 MG PO TABS: 650.0000 mg | ORAL_TABLET | ORAL | Status: DC | PRN

## 2018-12-31 MED ORDER — LIDOCAINE HCL (PF) 1 % IJ SOLN
INTRAMUSCULAR | Status: DC | PRN
  Administered 2018-12-31: 13 mL via EPIDURAL

## 2018-12-31 MED ORDER — FENTANYL CITRATE (PF) 100 MCG/2ML IJ SOLN
100.0000 ug | INTRAMUSCULAR | Status: DC | PRN
  Administered 2018-12-31 (×3): 100 ug via INTRAVENOUS
  Filled 2018-12-31 (×3): qty 2

## 2018-12-31 MED ORDER — FENTANYL 2.5 MCG/ML BUPIVACAINE 1/10 % EPIDURAL INFUSION (WH - ANES)
14.0000 mL/h | INTRAMUSCULAR | Status: DC | PRN
  Administered 2018-12-31: 14 mL/h via EPIDURAL

## 2018-12-31 MED ORDER — LACTATED RINGERS IV SOLN: 500.0000 mL | Freq: Once | INTRAVENOUS | Status: DC

## 2018-12-31 MED ORDER — ONDANSETRON HCL 4 MG/2ML IJ SOLN: 4.0000 mg | INTRAMUSCULAR | Status: DC | PRN

## 2018-12-31 MED ORDER — LACTATED RINGERS IV SOLN
500.0000 mL | INTRAVENOUS | Status: DC | PRN
  Administered 2018-12-31: 500 mL via INTRAVENOUS

## 2018-12-31 MED ORDER — ZOLPIDEM TARTRATE 5 MG PO TABS: 5.0000 mg | ORAL_TABLET | Freq: Every evening | ORAL | Status: DC | PRN

## 2018-12-31 MED ORDER — COCONUT OIL OIL: 1.0000 "application " | TOPICAL_OIL | Status: DC | PRN

## 2018-12-31 MED ORDER — DIPHENHYDRAMINE HCL 50 MG/ML IJ SOLN: 12.5000 mg | INTRAMUSCULAR | Status: DC | PRN

## 2018-12-31 MED ORDER — OXYTOCIN 40 UNITS IN NORMAL SALINE INFUSION - SIMPLE MED
2.5000 [IU]/h | INTRAVENOUS | Status: DC
  Administered 2018-12-31: 2.5 [IU]/h via INTRAVENOUS
  Filled 2018-12-31: qty 1000

## 2018-12-31 NOTE — H&P (Signed)
Michaele OfferMumaw, Elizabeth Woodland, DO  Physician  Obstetrics  OB Triage Note  Cosign Needed Addendum  Date of Service:  12/31/2018 8:50 AM             Show:Clear all [x] Manual[x] Template[x] Copied  Added by: [x] Isabel CapriceBarnett, Deborah J, Student-PA[x] Mumaw, Hiram ComberElizabeth Woodland, DO  [] Hover for details LABOR AND DELIVERY ADMISSION HISTORY AND PHYSICAL NOTE  Brooke Bush is a 28 y.o. female G2P1001 with IUP at 2748w0d presenting for induction of labor.  She reports positive fetal movement. She denies leakage of fluid or vaginal bleeding. She feels apprehensive, but is not feeling any contractions or pain at this time.   [facility] Pregnancy complications:  - none  Past Medical History:     Past Medical History:  Diagnosis Date  . Anemia   . External hemorrhoids   . GERD (gastroesophageal reflux disease)   . Vaginal Pap smear, abnormal     Past Surgical History:      Past Surgical History:  Procedure Laterality Date  . NO PAST SURGERIES    . WISDOM TOOTH EXTRACTION      Obstetrical History:         OB History    Gravida  2   Para  1   Term  1   Preterm      AB      Living  1     SAB      TAB      Ectopic      Multiple      Live Births  1           Social History: Social History        Socioeconomic History  . Marital status: Single    Spouse name: Not on file  . Number of children: Not on file  . Years of education: Not on file  . Highest education level: Not on file  Occupational History  . Not on file  Social Needs  . Financial resource strain: Not on file  . Food insecurity:    Worry: Not on file    Inability: Not on file  . Transportation needs:    Medical: Not on file    Non-medical: Not on file  Tobacco Use  . Smoking status: Never Smoker  . Smokeless tobacco: Never Used  Substance and Sexual Activity  . Alcohol use: No  . Drug use: No  .  Sexual activity: Yes    Birth control/protection: None  Lifestyle  . Physical activity:    Days per week: Not on file    Minutes per session: Not on file  . Stress: Not on file  Relationships  . Social connections:    Talks on phone: Not on file    Gets together: Not on file    Attends religious service: Not on file    Active member of club or organization: Not on file    Attends meetings of clubs or organizations: Not on file    Relationship status: Not on file  Other Topics Concern  . Not on file  Social History Narrative  . Not on file    Family History: History reviewed. No pertinent family history.  Allergies:     Allergies  Allergen Reactions  . Sunscreens   . Chapstick Rash           Medications Prior to Admission  Medication Sig Dispense Refill Last Dose  . Prenatal Multivit-Min-Fe-FA (PRENATAL VITAMINS) 0.8 MG tablet  Take 1 tablet by mouth daily. 30 tablet 0 Taking     Review of Systems  All systems reviewed and negative except as stated in HPI  Physical Exam Blood pressure 111/75, pulse (!) 108, temperature 98.2 F (36.8 C), temperature source Oral, resp. rate 18, height 5' 1.5" (1.562 m), weight 60.1 kg, last menstrual period 03/19/2018, SpO2 100 %, currently breastfeeding. General appearance: alert, oriented, NAD Lungs: normal respiratory effort Heart: regular rate Extremities: No calf swelling or tenderness Presentation:  Fetal monitoring:  Uterine activity:  Dilation: 1 Effacement (%): 50 Station: -2 Exam by:: Lorn Junes, RN  Prenatal labs: ABO, Rh: O/Positive/-- (06/24 0000) Antibody: Negative (06/24 0000) Rubella: Immune (06/24 0000) RPR: Nonreactive (06/24 0000)  HBsAg: Negative (06/24 0000)  HIV: Non-reactive (06/24 0000)  GC/Chlamydia: negative GBS: Negative (12/10 0000)  2-hr GTT: negative Genetic screening:   Anatomy US: Normal  Prenatal Transfer Tool  Maternal Diabetes: No Genetic Screening:  Normal Maternal Ultrasounds/Referrals: Normal Fetal Ultrasounds or other Referrals:   Maternal Substance Abuse:  No Significant Maternal Medications:  None Significant Maternal Lab Results: None; GBS Negative       Results for orders placed or performed during the hospital encounter of 12/31/18 (from the past 24 hour(s))  CBC   Collection Time: 12/31/18  7:29 AM  Result Value Ref Range   WBC 14.8 (H) 4.0 - 10.5 K/uL   RBC 4.81 3.87 - 5.11 MIL/uL   Hemoglobin 12.8 12.0 - 15.0 g/dL   HCT 21.2 24.8 - 25.0 %   MCV 78.2 (L) 80.0 - 100.0 fL   MCH 26.6 26.0 - 34.0 pg   MCHC 34.0 30.0 - 36.0 g/dL   RDW 03.7 04.8 - 88.9 %   Platelets 307 150 - 400 K/uL   nRBC 0.0 0.0 - 0.2 %        Patient Active Problem List   Diagnosis Date Noted  . Post-dates pregnancy 12/31/2018  . NSVD (normal spontaneous vaginal delivery) 04/21/2014  . PHARYNGITIS 07/18/2008  . MENORRHALGIA 07/18/2008    Assessment: SHAYNNA LABAN is a 28 y.o. G2P1001 at [redacted]w[redacted]d here for induction of labor. Nurse reports baby looks flat, so Cytotec has not been started at this time.  #Labor: pt is not experiencing contractions at this point #Pain:  Pt denies pain  #FWB:  #ID:      GBs negative #MOF:  #MOC: #Circ:     [name] 12/31/2018, 8:50 AM   Attestation of Attending Supervision of Medical Student: Evaluation and management procedures / H&P were performed by the PA Student under my supervision. I confirm that I have verified the information documented in the PA student's note, and that I have also personally reperformed the physical exam and all medical decision making activities. I agree with management and plan as outlined in the documentation.   28 y/o G2P1001 at 34 0/[redacted] weeks EGA, here for IOL for postdates.   FHT: REACTIVE, 120, mod var, +accels, no decels CTX: Infrequent  Plan for cytotec to begin IOL.   Jen Mow, DO OB Fellow, [facility] for [employer], [facility] 12/31/2018  10:04 AM      Attestation of Attending Supervision of OB Fellow: Evaluation and management procedures were performed by the [facility] Fellow under my supervision and collaboration.  I have reviewed the OB Fellow's note and chart, and I agree with the management and plan.  Baldemar Lenis, M.D. Attending Center for [employer] (Faculty Practice)  12/31/2018  4:19 PM

## 2018-12-31 NOTE — OB Triage Note (Cosign Needed Addendum)
LABOR AND DELIVERY ADMISSION HISTORY AND PHYSICAL NOTE  Brooke Bush is a 28 y.o. female G2P1001 with IUP at [redacted]w[redacted]d presenting for induction of labor.  She reports positive fetal movement. She denies leakage of fluid or vaginal bleeding. She feels apprehensive, but is not feeling any contractions or pain at this time.   Prenatal History/Complications: [facility] Pregnancy complications:  - none  Past Medical History: Past Medical History:  Diagnosis Date  . Anemia   . External hemorrhoids   . GERD (gastroesophageal reflux disease)   . Vaginal Pap smear, abnormal     Past Surgical History: Past Surgical History:  Procedure Laterality Date  . NO PAST SURGERIES    . WISDOM TOOTH EXTRACTION      Obstetrical History: OB History    Gravida  2   Para  1   Term  1   Preterm      AB      Living  1     SAB      TAB      Ectopic      Multiple      Live Births  1           Social History: Social History   Socioeconomic History  . Marital status: Single    Spouse name: Not on file  . Number of children: Not on file  . Years of education: Not on file  . Highest education level: Not on file  Occupational History  . Not on file  Social Needs  . Financial resource strain: Not on file  . Food insecurity:    Worry: Not on file    Inability: Not on file  . Transportation needs:    Medical: Not on file    Non-medical: Not on file  Tobacco Use  . Smoking status: Never Smoker  . Smokeless tobacco: Never Used  Substance and Sexual Activity  . Alcohol use: No  . Drug use: No  . Sexual activity: Yes    Birth control/protection: None  Lifestyle  . Physical activity:    Days per week: Not on file    Minutes per session: Not on file  . Stress: Not on file  Relationships  . Social connections:    Talks on phone: Not on file    Gets together: Not on file    Attends religious service: Not on file    Active member of club or  organization: Not on file    Attends meetings of clubs or organizations: Not on file    Relationship status: Not on file  Other Topics Concern  . Not on file  Social History Narrative  . Not on file    Family History: History reviewed. No pertinent family history.  Allergies: Allergies  Allergen Reactions  . Sunscreens   . Chapstick Rash    Medications Prior to Admission  Medication Sig Dispense Refill Last Dose  . Prenatal Multivit-Min-Fe-FA (PRENATAL VITAMINS) 0.8 MG tablet Take 1 tablet by mouth daily. 30 tablet 0 Taking     Review of Systems  All systems reviewed and negative except as stated in HPI  Physical Exam Blood pressure 111/75, pulse (!) 108, temperature 98.2 F (36.8 C), temperature source Oral, resp. rate 18, height 5' 1.5" (1.562 m), weight 60.1 kg, last menstrual period 03/19/2018, SpO2 100 %, currently breastfeeding. General appearance: alert, oriented, NAD Lungs: normal respiratory effort Heart: regular rate Extremities: No calf swelling or tenderness Presentation:  Fetal  monitoring:  Uterine activity:  Dilation: 1 Effacement (%): 50 Station: -2 Exam by:: Lorn Junes, RN  Prenatal labs: ABO, Rh: O/Positive/-- (06/24 0000) Antibody: Negative (06/24 0000) Rubella: Immune (06/24 0000) RPR: Nonreactive (06/24 0000)  HBsAg: Negative (06/24 0000)  HIV: Non-reactive (06/24 0000)  GC/Chlamydia: negative GBS: Negative (12/10 0000)  2-hr GTT: negative Genetic screening:   Anatomy US: Normal  Prenatal Transfer Tool  Maternal Diabetes: No Genetic Screening: Normal Maternal Ultrasounds/Referrals: Normal Fetal Ultrasounds or other Referrals:   Maternal Substance Abuse:  No Significant Maternal Medications:  None Significant Maternal Lab Results: None; GBS Negative  Results for orders placed or performed during the hospital encounter of 12/31/18 (from the past 24 hour(s))  CBC   Collection Time: 12/31/18  7:29 AM  Result Value Ref Range   WBC  14.8 (H) 4.0 - 10.5 K/uL   RBC 4.81 3.87 - 5.11 MIL/uL   Hemoglobin 12.8 12.0 - 15.0 g/dL   HCT 84.2 10.3 - 12.8 %   MCV 78.2 (L) 80.0 - 100.0 fL   MCH 26.6 26.0 - 34.0 pg   MCHC 34.0 30.0 - 36.0 g/dL   RDW 11.8 86.7 - 73.7 %   Platelets 307 150 - 400 K/uL   nRBC 0.0 0.0 - 0.2 %    Patient Active Problem List   Diagnosis Date Noted  . Post-dates pregnancy 12/31/2018  . NSVD (normal spontaneous vaginal delivery) 04/21/2014  . PHARYNGITIS 07/18/2008  . MENORRHALGIA 07/18/2008    Assessment: Brooke Bush is a 28 y.o. G2P1001 at 109w0d here for induction of labor. Nurse reports baby looks flat, so Cytotec has not been started at this time.  #Labor: pt is not experiencing contractions at this point #Pain: Pt denies pain  #FWB:  #ID:  GBs negative #MOF:  #MOC: #Circ:    Brooke Bush 12/31/2018, 8:50 AM   Attestation of Attending Supervision of Medical Student: Evaluation and management procedures / H&P were performed by the PA Student under my supervision. I confirm that I have verified the information documented in the PA student's note, and that I have also personally reperformed the physical exam and all medical decision making activities.  I agree with management and plan as outlined in the documentation.   28 y/o G2P1001 at 60 0/[redacted] weeks EGA, here for IOL for postdates.   FHT: REACTIVE, 120, mod var, +accels, no decels CTX: Infrequent  Plan for cytotec to begin IOL.   Jen Mow, DO OB Fellow, [facility] for [employer], [facility] 12/31/2018  10:04 AM

## 2018-12-31 NOTE — Anesthesia Pain Management Evaluation Note (Signed)
  [facility] Pain Management Visit Note  Patient: Brooke Bush, 28 y.o., female  "Hello I am a member of the anesthesia team at [facility]. We have an anesthesia team available at all times to provide care throughout the hospital, including epidural management and anesthesia for C-section. I don't know your plan for the delivery whether it a natural birth, water birth, IV sedation, nitrous supplementation, doula or epidural, but we want to meet your pain goals."   1.Was your pain managed to your expectations on prior hospitalizations?   Yes   2.What is your expectation for pain management during this hospitalization?     Epidural  3.How can we help you reach that goal? epidural  Record the patient's initial score and the patient's pain goal.   Pain: 2  Pain Goal: 4 [facility] wants you to be able to say your pain was always managed very well.  Kamry Faraci 12/31/2018

## 2018-12-31 NOTE — Progress Notes (Signed)
Labor Progress Note Brooke Bush is a 28 y.o. G2P1001 at [redacted]w[redacted]d presented for IOL S:  Patient reports pain with contractions that is rated 9/10. She states that the IV pain medicine has worn off since the last time she's had it about 50 minutes ago. Patient wants to wait until she is closer to delivery to start epidural. Patient reports that her FB has not fallen out, however, she has not gotten up to walk around and hasn't checked. She denies LOF and VB.   O:  BP 94/68   Pulse (!) 103   Temp 98 F (36.7 C) (Oral)   Resp 16   Ht 5' 1.5" (1.562 m)   Wt 60.1 kg   LMP 03/19/2018 (Exact Date)   SpO2 100%   BMI 24.61 kg/m    CVE: Dilation: 6 Effacement (%): 100 Station: 0 Presentation: Vertex Exam by:: lee FHT: Baseline rate 125 bpm; moderate variability; + acel, no decels Toco: contraction frequency q3-4 mins  A&P: 28 y.o. G2P1001 [redacted]w[redacted]d admitted for IOL #Labor: Progressing well. cytotec x 1. FB in place. Continue with serial cervical checks. Plan for Pitocin #Pain: IV fentanyl last given 1846. Plan for epidural  #FWB: category 1 #GBS negative   [name], Student-PA 7:14 PM

## 2018-12-31 NOTE — Anesthesia Preprocedure Evaluation (Addendum)
Anesthesia Evaluation  Patient identified by MRN, date of birth, ID band Patient awake    Reviewed: Allergy & Precautions, H&P , NPO status , Patient's Chart, lab work & pertinent test results  Airway Mallampati: II  TM Distance: >3 FB Neck ROM: Full    Dental no notable dental hx.    Pulmonary neg pulmonary ROS,    Pulmonary exam normal breath sounds clear to auscultation       Cardiovascular negative cardio ROS Normal cardiovascular exam Rhythm:Regular Rate:Normal     Neuro/Psych negative neurological ROS  negative psych ROS   GI/Hepatic negative GI ROS, Neg liver ROS,   Endo/Other  negative endocrine ROS  Renal/GU negative Renal ROS     Musculoskeletal negative musculoskeletal ROS (+)   Abdominal   Peds  Hematology negative hematology ROS (+)   Anesthesia Other Findings   Reproductive/Obstetrics (+) Pregnancy                            Anesthesia Physical  Anesthesia Plan  ASA: II  Anesthesia Plan: Epidural   Post-op Pain Management:    Induction:   PONV Risk Score and Plan:   Airway Management Planned:   Additional Equipment:   Intra-op Plan:   Post-operative Plan:   Informed Consent: I have reviewed the patients History and Physical, chart, labs and discussed the procedure including the risks, benefits and alternatives for the proposed anesthesia with the patient or authorized representative who has indicated his/her understanding and acceptance.     Plan Discussed with:   Anesthesia Plan Comments:         Anesthesia Quick Evaluation

## 2018-12-31 NOTE — Discharge Summary (Addendum)
OB Discharge Summary    Patient Name: Brooke Bush DOB: 11-04-1991 MRN: [id number]  Date of admission: 12/31/2018 Delivering MD: Peggyann Shoals C   Date of discharge: 01/02/2019  Admitting diagnosis: 41wks induction Intrauterine pregnancy: [redacted]w[redacted]d     Secondary diagnosis:  Active Problems:   Post-dates pregnancy  Additional problems: None     Discharge diagnosis: Term Pregnancy Delivered                                                                                                Post partum procedures:None  Augmentation: AROM, Pitocin, Cytotec and Foley Balloon  Complications: None  Hospital course:  Induction of Labor for postdates With Vaginal Delivery   28 y.o. yo [id number] at [redacted]w[redacted]d was admitted to the hospital 12/31/2018 for induction of labor.  Indication for induction: Postdates.  Patient had an uncomplicated labor course as follows: Membrane Rupture Time/Date: 6:51 PM ,12/31/2018   Intrapartum Procedures: Episiotomy: None [1]                                         Lacerations:  2nd degree [3]  Patient had delivery of a Viable infant.  Information for the patient's newborn:  Kole, Troia Girl Sole Wheatley [[id number]]  Delivery Method: Vaginal, Spontaneous(Filed from Delivery Summary)   12/31/2018  Details of delivery can be found in separate delivery note.  Patient had a routine postpartum course. Patient is discharged home 01/02/19.  Physical exam  Vitals:   01/01/19 1211 01/01/19 1712 01/01/19 2300 01/02/19 0525  BP: 113/69 100/64 115/76 103/63  Pulse: 90 77 91 75  Resp: 20 20 20 18   Temp: (!) 97.5 F (36.4 C) 97.7 F (36.5 C) 97.8 F (36.6 C) 97.8 F (36.6 C)  TempSrc: Oral Oral Oral Oral  SpO2: 100% 100%  100%  Weight:      Height:       General: alert, cooperative and no distress Lochia: appropriate Uterine Fundus: firm Incision: Healing well with no significant drainage DVT Evaluation: No evidence of DVT seen on physical exam. Negative Homan's sign. No cords or calf  tenderness. No significant calf/ankle edema. Labs: Lab Results  Component Value Date   WBC 17.6 (H) 01/01/2019   HGB 11.2 (L) 01/01/2019   HCT 32.4 (L) 01/01/2019   MCV 77.7 (L) 01/01/2019   PLT 261 01/01/2019   CMP Latest Ref Rng & Units 04/12/2013  Glucose 70 - 99 mg/dL 76  BUN 6 - 23 mg/dL 11  Creatinine 7.79 - 3.90 mg/dL 3.00  Sodium 923 - 300 mEq/L 141  Potassium 3.5 - 5.1 mEq/L 3.9  Chloride 96 - 112 mEq/L 105  CO2 19 - 32 meq/L -  Calcium 8.4 - 10.5 mg/dL -  Total Protein 6.0 - 8.3 g/dL -  Total Bilirubin 0.3 - 1.2 mg/dL -  Alkaline Phos 47 - 762 units/L -  AST 0 - 37 units/L -  ALT 0 - 35 units/L -    Discharge instruction: per After Visit Summary and "Baby and  Me Booklet".  After visit meds:  Allergies as of 01/02/2019      Reactions   Chapstick Rash   Sunscreens Rash      Medication List    TAKE these medications   ibuprofen 600 MG tablet Commonly known as:  ADVIL,MOTRIN Take 1 tablet (600 mg total) by mouth every 6 (six) hours.   Prenatal Vitamins 0.8 MG tablet Take 1 tablet by mouth daily.   senna-docusate 8.6-50 MG tablet Commonly known as:  Senokot-S Take 2 tablets by mouth daily. Start taking on:  January 03, 2019       Diet: routine diet  Activity: Advance as tolerated. Pelvic rest for 6 weeks.   Outpatient follow up:4 weeks Follow up Appt:No future appointments. Follow up Visit:No follow-ups on file.  Postpartum contraception: Depo Provera  Newborn Data: Live born female  Birth Weight: 6 lb 7.5 oz (2934 g) APGAR: 8, 9  Newborn Delivery   Birth date/time:  12/31/2018 20:14:00 Delivery type:  Vaginal, Spontaneous     Baby Feeding: Breast Disposition:home with mother   01/02/2019 [name], DO   OB FELLOW DISCHARGE ATTESTATION  I have seen and examined this patient and agree with above documentation in the resident's note.   Marcy Siren, D.O. OB Fellow  01/04/2019, 2:35 PM

## 2018-12-31 NOTE — Anesthesia Procedure Notes (Signed)
Epidural Patient location during procedure: OB Start time: 12/31/2018 7:08 PM End time: 12/31/2018 7:20 PM  Staffing Anesthesiologist: Lowella CurbMiller, Warren Ray, MD Performed: anesthesiologist   Preanesthetic Checklist Completed: patient identified, site marked, surgical consent, pre-op evaluation, timeout performed, IV checked, risks and benefits discussed and monitors and equipment checked  Epidural Patient position: sitting Prep: ChloraPrep Patient monitoring: heart rate, cardiac monitor, continuous pulse ox and blood pressure Approach: midline Location: L2-L3 Injection technique: LOR saline  Needle:  Needle type: Tuohy  Needle gauge: 17 G Needle length: 9 cm Needle insertion depth: 5 cm Catheter type: closed end flexible Catheter size: 20 Guage Catheter at skin depth: 9 cm Test dose: negative  Assessment Events: blood not aspirated, injection not painful, no injection resistance, negative IV test and no paresthesia  Additional Notes Reason for block:procedure for pain

## 2019-01-01 LAB — CBC
HCT: 32.4 % — ABNORMAL LOW (ref 36.0–46.0)
Hemoglobin: 11.2 g/dL — ABNORMAL LOW (ref 12.0–15.0)
MCH: 26.9 pg (ref 26.0–34.0)
MCHC: 34.6 g/dL (ref 30.0–36.0)
MCV: 77.7 fL — ABNORMAL LOW (ref 80.0–100.0)
Platelets: 261 10*3/uL (ref 150–400)
RBC: 4.17 MIL/uL (ref 3.87–5.11)
RDW: 14 % (ref 11.5–15.5)
WBC: 17.6 10*3/uL — ABNORMAL HIGH (ref 4.0–10.5)
nRBC: 0 % (ref 0.0–0.2)

## 2019-01-01 NOTE — Anesthesia Postprocedure Evaluation (Signed)
Anesthesia Post Note  Patient: Brooke Bush  Procedure(s) Performed: AN AD HOC LABOR EPIDURAL     Patient location during evaluation: Mother Baby Anesthesia Type: Epidural Level of consciousness: awake and alert, oriented and patient cooperative Pain management: pain level controlled Vital Signs Assessment: post-procedure vital signs reviewed and stable Respiratory status: spontaneous breathing Cardiovascular status: stable Postop Assessment: no headache, epidural receding, patient able to bend at knees, no signs of nausea or vomiting and able to ambulate Anesthetic complications: no Comments: Pain score 3.    Last Vitals:  Vitals:   01/01/19 0610 01/01/19 0825  BP: 102/73 103/74  Pulse: 76 90  Resp:  18  Temp:  36.4 C  SpO2:  100%    Last Pain:  Vitals:   01/01/19 0825  TempSrc: Oral  PainSc: 0-No pain   Pain Goal:                 [facility]

## 2019-01-01 NOTE — Lactation Note (Signed)
This note was copied from a baby's chart. Lactation Consultation Note  Patient Name: Brooke Bush Date: 01/01/2019 Reason for consult: Initial assessment;Term  69 hours old FT female who is being exclusively BF by her mother, she's a P2 and experienced BF. She was able to BF her first child for 2 months and had to stop due to infant's separation when she had to go back to work. She also plans to do the same with this baby, only BF for 2 months (in addition to supplementing) and do 100% formula afterwards.   Mom participated in the [facility] program at the [facility] and she's already familiar with hand expression. When [facility] assisted with hand expression prior latching, big drops of colostrum were obtained easily. Mom doesn't have a pump at home, [facility] offered one from the hospital, instructions, cleaning and storage were reviewed as well as milk storage guidelines. Discussed feeding cues and normal newborn behavior.  Baby was asleep when entering the room, offered assistance with latch and mom agreed to wake baby up to feed. LC finger fed baby prior latching, mom has very everted nipples, baby latched on immediately at the first try in cross cradle position (mom kept switching to typical cradle) with a few audible swallows heard upon breast compressions. Baby still nursing when exiting the room at 11 minutes throughout the feeding.  Feeding plan:  1. Encouraged mom to feed baby STS 8-12 times/24 hours or sooner if feeding cues are present 2. Hand expression and spoon feeding were also encouraged  BF brochure, BF resources and feeding diary were reviewed. Parents reported all questions and concerns were answered, they're both aware of LC services and will call PRN.   Maternal Data Formula Feeding for Exclusion: Yes Reason for exclusion: Mother's choice to formula and breast feed on admission Has patient been taught Hand Expression?: Yes Does the patient have breastfeeding experience prior to this  delivery?: Yes  Feeding Feeding Type: Breast Fed  LATCH Score Latch: Grasps breast easily, tongue down, lips flanged, rhythmical sucking.  Audible Swallowing: A few with stimulation(with breast compressions)  Type of Nipple: Everted at rest and after stimulation  Comfort (Breast/Nipple): Soft / non-tender  Hold (Positioning): Assistance needed to correctly position infant at breast and maintain latch.  LATCH Score: 8  Interventions Interventions: Breast feeding basics reviewed;Assisted with latch;Skin to skin;Breast massage;Hand express;Breast compression;Hand pump;Adjust position  Lactation Tools Discussed/Used Tools: Pump Breast pump type: Manual WIC Program: Yes Pump Review: Setup, frequency, and cleaning;Milk Storage Initiated by:: MPeck Date initiated:: 01/01/19   Consult Status Consult Status: PRN Date: 01/02/19 Follow-up type: In-patient    Brooke Bush 01/01/2019, 1:42 PM

## 2019-01-01 NOTE — Progress Notes (Addendum)
POSTPARTUM PROGRESS NOTE  Post Partum Day 1  Subjective:  Brooke Bush is a 28 y.o. [id number] s/p SVD at [redacted]w[redacted]d  She reports she is doing well. No acute events overnight. She denies any problems with ambulating, voiding or po intake. Denies nausea or vomiting.  Pain is well controlled.  Lochia is appropriate. Received report that the overnight nurse noticed some edema and possible concern for a vaginal hematoma. Pt used an ice pack overnight. Pt reports mild vaginal tenderness but otherwise has not noticed any new vaginal pain/abnormalities.  Objective: Blood pressure 103/74, pulse 90, temperature 97.6 F (36.4 C), temperature source Oral, resp. rate 18, height 5' 1.5" (1.562 m), weight 60.1 kg, last menstrual period 03/19/2018, SpO2 100 %, unknown if currently breastfeeding.  Physical Exam:  General: alert, cooperative and no distress Chest: no respiratory distress Heart:regular rate, distal pulses intact Abdomen: soft, nontender Uterine Fundus: firm, appropriately tender DVT Evaluation: No calf swelling or tenderness Extremities: No edema Skin: warm, dry GU: Mild left labia minora edema, 1x1cm area on left labia minora of possible tissue edema or hematoma. No significant tenderness to palpation.   Recent Labs    12/31/18 0729 01/01/19 0541  HGB 12.8 11.2*  HCT 37.6 32.4*    Assessment/Plan: Brooke Bush is a 28y.o. [id number]/p SVD at 427w0d PPD#1 - Doing well Routine postpartum care Contraception: Depo Feeding: Breast Dispo: Plan for discharge tomorrow. Vaginal edema: Exam is reassuring, appears to be small area of increased edema vs. 1x1cm hematoma. Will continue to monitor, no indication for intervention.     LOS: 1 day   ElCarilyn GoodpastureS3 01/01/2019, 9:02 AM   I personally saw and evaluated the patient, performing the key elements of the service. I developed and verified the management plan that is described in the resident's/student's note, and I agree with the  content with my edits above. VSS, HRR&R, Resp unlabored, Legs neg.  FrNigel BertholdCNM 01/04/2019 8:54 AM

## 2019-01-02 MED ORDER — IBUPROFEN 600 MG PO TABS: 600.0000 mg | ORAL_TABLET | Freq: Four times a day (QID) | ORAL | 0 refills | Status: DC

## 2019-01-02 MED ORDER — SENNOSIDES-DOCUSATE SODIUM 8.6-50 MG PO TABS: 2.0000 | ORAL_TABLET | ORAL | 0 refills | Status: DC

## 2019-01-02 NOTE — Lactation Note (Signed)
This note was copied from a baby's chart. Lactation Consultation Note  Patient Name: Brooke Bush Date: 01/02/2019 Reason for consult: Follow-up assessment;Term Mom states feedings are going well.  Discussed milk coming to volume and the prevention and treatment of engorgement.  Mom has a manual pump for home use.  Lactation outpatient services and support reviewed and encouraged prn.  Maternal Data    Feeding    LATCH Score                   Interventions    Lactation Tools Discussed/Used     Consult Status Consult Status: Complete Follow-up type: Call as needed    Huston Foley 01/02/2019, 11:45 AM

## 2020-09-10 ENCOUNTER — Ambulatory Visit (HOSPITAL_COMMUNITY)
Admission: EM | Admit: 2020-09-10 | Discharge: 2020-09-10 | Disposition: A | Discharge status: Home / Self Care | Payer: Medicaid Other | Attending: Emergency Medicine | Admitting: Emergency Medicine

## 2020-09-10 ENCOUNTER — Other Ambulatory Visit: Payer: Self-pay

## 2020-09-10 ENCOUNTER — Encounter (HOSPITAL_COMMUNITY): Payer: Self-pay | Admitting: Emergency Medicine

## 2020-09-10 DIAGNOSIS — N898 Other specified noninflammatory disorders of vagina: Secondary | ICD-10-CM | POA: Diagnosis not present

## 2020-09-10 DIAGNOSIS — N760 Acute vaginitis: Secondary | ICD-10-CM

## 2020-09-10 DIAGNOSIS — R102 Pelvic and perineal pain: Secondary | ICD-10-CM

## 2020-09-10 DIAGNOSIS — Z3202 Encounter for pregnancy test, result negative: Secondary | ICD-10-CM

## 2020-09-10 DIAGNOSIS — M25552 Pain in left hip: Secondary | ICD-10-CM | POA: Diagnosis present

## 2020-09-10 LAB — POCT URINALYSIS DIPSTICK, ED / UC
Bilirubin Urine: NEGATIVE
Glucose, UA: NEGATIVE mg/dL
Ketones, ur: NEGATIVE mg/dL
Leukocytes,Ua: NEGATIVE
Nitrite: NEGATIVE
Protein, ur: NEGATIVE mg/dL
Specific Gravity, Urine: 1.01 (ref 1.005–1.030)
Urobilinogen, UA: 0.2 mg/dL (ref 0.0–1.0)
pH: 6 (ref 5.0–8.0)

## 2020-09-10 LAB — POC URINE PREG, ED: Preg Test, Ur: NEGATIVE

## 2020-09-10 MED ORDER — NAPROXEN 500 MG PO TABS: 500.0000 mg | ORAL_TABLET | Freq: Two times a day (BID) | ORAL | 0 refills | Status: DC

## 2020-09-10 NOTE — Discharge Instructions (Addendum)
Your urine looks well today which is reassuring.  We will notify of you any positive findings from your vaginal swab or if any changes to treatment are needed. If normal or otherwise without concern to your results, we will not call you. Please log on to your MyChart to review your results if interested in so.   May start naproxen twice a day, take with food, to help with pain.  Please follow up with gynecology as you may and likely need further imaging and evaluation, potentially referral for physical therapy as well.  If worsening of symptoms please return or go to the ER.

## 2020-09-10 NOTE — ED Triage Notes (Signed)
Pt presents with pelvic pain and discharge xs 1 month. States comes and goes. States order to discharge, last for a week at a time and stops.

## 2020-09-10 NOTE — ED Provider Notes (Signed)
[facility]    CSN: [id number] Arrival date & time: 09/10/20  1104      History   Chief Complaint Chief Complaint  Patient presents with   Pelvic Pain   Vaginal Discharge    HPI Brooke Bush is a 29 y.o. female.   Brooke Bush presents with complaints of  LLW abdominal/ pelvic pain. Radiates to groin and down left leg. Pain radiates to coccyx. Concerns about "yeast infection" (with vaginal discharge) which comes and goes as well as vaginal swelling. Symptoms started 6 months ago. Can last 3-7 days at a time. Pain to left hip is constant but can worsen with activity. It can go away completely intermittently.  When she has increased vaginal discharge she has more pain to the left hip/ abdomen. Some itching, mild, but complains more of swelling to vulva. Feels like the swelling was worse three days ago but has improved some today. No pain with urination. Urinary frequency. Pain with a full bladder. Has had UTI's in the past. Has had yeast infections in the past. Sexually active with 1 partner, uses condoms, denies concerns for STDs. Pain with intercourse. She has two children. She feels like her symptoms have worsened after she had her most recent child 1.5 years ago. Her periods have been irregular. She had been getting depo injections. Has intermittent spotting. Hasn't spotted in the past two weeks. Last Depo was three months ago, she states she is no longer going to get this and will use condoms.    ROS per HPI, negative if not otherwise mentioned.      Past Medical History:  Diagnosis Date   Anemia    External hemorrhoids    GERD (gastroesophageal reflux disease)    Vaginal Pap smear, abnormal     Patient Active Problem List   Diagnosis Date Noted   Post-dates pregnancy 12/31/2018   NSVD (normal spontaneous vaginal delivery) 04/21/2014   PHARYNGITIS 07/18/2008   MENORRHALGIA 07/18/2008    Past Surgical History:  Procedure Laterality Date   NO PAST  SURGERIES     WISDOM TOOTH EXTRACTION      OB History    Gravida  2   Para  2   Term  2   Preterm      AB      Living  2     SAB      TAB      Ectopic      Multiple  0   Live Births  2            Home Medications    Prior to Admission medications   Medication Sig Start Date End Date Taking? Authorizing Provider  ibuprofen (ADVIL,MOTRIN) 600 MG tablet Take 1 tablet (600 mg total) by mouth every 6 (six) hours. 01/02/19   [name], DO  naproxen (NAPROSYN) 500 MG tablet Take 1 tablet (500 mg total) by mouth 2 (two) times daily. 09/10/20   Georgetta Haber, NP  Prenatal Multivit-Min-Fe-FA (PRENATAL VITAMINS) 0.8 MG tablet Take 1 tablet by mouth daily. 05/17/18   Wurst, [place], PA-C  senna-docusate (SENOKOT-S) 8.6-50 MG tablet Take 2 tablets by mouth daily. 01/03/19   [name], DO    Family History History reviewed. No pertinent family history.  Social History Social History   Tobacco Use   Smoking status: Never Smoker   Smokeless tobacco: Never Used  Substance Use Topics   Alcohol use: No   Drug use: No  Allergies   Chapstick and Sunscreens   Review of Systems Review of Systems   Physical Exam Triage Vital Signs ED Triage Vitals  Enc Vitals Group     BP 09/10/20 1225 106/71     Pulse Rate 09/10/20 1225 86     Resp 09/10/20 1225 16     Temp 09/10/20 1225 98.6 F (37 C)     Temp Source 09/10/20 1225 Oral     SpO2 09/10/20 1225 100 %     Weight --      Height --      Head Circumference --      Peak Flow --      Pain Score 09/10/20 1222 6     Pain Loc --      Pain Edu? --      Excl. in GC? --    No data found.  Updated Vital Signs BP 106/71 (BP Location: Right Arm)    Pulse 86    Temp 98.6 F (37 C) (Oral)    Resp 16    LMP 09/03/2020    SpO2 100%   Visual Acuity Right Eye Distance:   Left Eye Distance:   Bilateral Distance:    Right Eye Near:   Left Eye Near:    Bilateral Near:     Physical  Exam Constitutional:      General: She is not in acute distress.    Appearance: She is well-developed.  HENT:     Mouth/Throat:     Mouth: Mucous membranes are moist.  Cardiovascular:     Rate and Rhythm: Normal rate.  Pulmonary:     Effort: Pulmonary effort is normal.  Abdominal:     Tenderness: There is no abdominal tenderness. There is no right CVA tenderness, left CVA tenderness or guarding.  Genitourinary:    Labia:        Right: No tenderness.        Left: No tenderness.      Cervix: Discharge present. No cervical motion tenderness.     Comments: White vaginal discharge, thick, noted to vulva externally, as well as within vagina; no cottage cheese appearing discharge; no active bleeding; LLQ tenderness on bimanual exam without palpable abnormality/ mass  Musculoskeletal:     Comments: Left low back with mild muscular tenderness   Skin:    General: Skin is warm and dry.  Neurological:     Mental Status: She is alert and oriented to person, place, and time.      UC Treatments / Results  Labs (all labs ordered are listed, but only abnormal results are displayed) Labs Reviewed  POCT URINALYSIS DIPSTICK, ED / UC - Abnormal; Notable for the following components:      Result Value   Hgb urine dipstick TRACE (*)    All other components within normal limits  POC URINE PREG, ED  CERVICOVAGINAL ANCILLARY ONLY    EKG   Radiology No results found.  Procedures Procedures (including critical care time)  Medications Ordered in UC Medications - No data to display  Initial Impression / Assessment and Plan / UC Course  I have reviewed the triage vital signs and the nursing notes.  Pertinent labs & imaging results that were available during my care of the patient were reviewed by me and considered in my medical decision making (see chart for details).     Vaginitis and likely pelvic floor dysfunction, concern for possible ovarian cyst as well. nsaids provided today,  vaginal cytology  pending. Encouraged close follow up with gyne as may need pelvic ultrasound, pelvic floor P/T may also be appropriate. Return precautions provided. Patient verbalized understanding and agreeable to plan.   Final Clinical Impressions(s) / UC Diagnoses   Final diagnoses:  Acute vaginitis  Pain in joint involving left pelvic region and thigh     Discharge Instructions     Your urine looks well today which is reassuring.  We will notify of you any positive findings from your vaginal swab or if any changes to treatment are needed. If normal or otherwise without concern to your results, we will not call you. Please log on to your MyChart to review your results if interested in so.   May start naproxen twice a day, take with food, to help with pain.  Please follow up with gynecology as you may and likely need further imaging and evaluation, potentially referral for physical therapy as well.  If worsening of symptoms please return or go to the ER.     ED Prescriptions    Medication Sig Dispense Auth. Provider   naproxen (NAPROSYN) 500 MG tablet Take 1 tablet (500 mg total) by mouth 2 (two) times daily. 30 tablet Georgetta Haber, NP     PDMP not reviewed this encounter.   Georgetta Haber, NP 09/10/20 1515

## 2020-09-11 LAB — CERVICOVAGINAL ANCILLARY ONLY
Bacterial Vaginitis (gardnerella): NEGATIVE
Candida Glabrata: NEGATIVE
Candida Vaginitis: POSITIVE — AB
Chlamydia: NEGATIVE
Comment: NEGATIVE
Comment: NEGATIVE
Comment: NEGATIVE
Comment: NEGATIVE
Comment: NEGATIVE
Comment: NORMAL
Neisseria Gonorrhea: NEGATIVE
Trichomonas: NEGATIVE

## 2020-09-14 ENCOUNTER — Telehealth (HOSPITAL_COMMUNITY): Payer: Self-pay | Admitting: Emergency Medicine

## 2020-09-14 MED ORDER — FLUCONAZOLE 150 MG PO TABS: 150.0000 mg | ORAL_TABLET | Freq: Once | ORAL | 0 refills | Status: AC

## 2020-10-31 ENCOUNTER — Other Ambulatory Visit: Payer: Self-pay

## 2020-10-31 ENCOUNTER — Ambulatory Visit (INDEPENDENT_AMBULATORY_CARE_PROVIDER_SITE_OTHER): Payer: Medicaid Other | Admitting: Obstetrics & Gynecology

## 2020-10-31 ENCOUNTER — Other Ambulatory Visit (HOSPITAL_COMMUNITY)
Admission: RE | Admit: 2020-10-31 | Discharge: 2020-10-31 | Disposition: A | Discharge status: Home / Self Care | Payer: Medicaid Other | Source: Ambulatory Visit | Attending: Obstetrics & Gynecology | Admitting: Obstetrics & Gynecology

## 2020-10-31 ENCOUNTER — Encounter: Payer: Self-pay | Admitting: Obstetrics & Gynecology

## 2020-10-31 VITALS — BP 107/73 | HR 87 | Ht 62.0 in | Wt 103.5 lb

## 2020-10-31 DIAGNOSIS — N898 Other specified noninflammatory disorders of vagina: Secondary | ICD-10-CM

## 2020-10-31 DIAGNOSIS — R102 Pelvic and perineal pain: Secondary | ICD-10-CM | POA: Diagnosis not present

## 2020-10-31 NOTE — Progress Notes (Signed)
Patient ID: Brooke Bush, female   DOB: June 10, 1991, 29 y.o.   MRN: [id number]  Chief Complaint  Patient presents with  . Pelvic Pain    HPI Brooke Bush is a 29 y.o. female.  [id number] Patient's last menstrual period was 10/16/2020. For 1-1.5 years she has had LLQ pain with upper thigh and low back pain as well. Dyspareunia as well. She uses condoms for [facility], stopped DMPA more than 6 months ago.  HPI  Past Medical History:  Diagnosis Date  . Anemia   . External hemorrhoids   . GERD (gastroesophageal reflux disease)   . Vaginal Pap smear, abnormal     Past Surgical History:  Procedure Laterality Date  . NO PAST SURGERIES    . WISDOM TOOTH EXTRACTION      History reviewed. No pertinent family history.  Social History Social History   Tobacco Use  . Smoking status: Never Smoker  . Smokeless tobacco: Never Used  Vaping Use  . Vaping Use: Never used  Substance Use Topics  . Alcohol use: No  . Drug use: No    Allergies  Allergen Reactions  . Neutrogena Lip Moisturizer Rash  . Sunscreens Rash    Current Outpatient Medications  Medication Sig Dispense Refill  . ibuprofen (ADVIL,MOTRIN) 600 MG tablet Take 1 tablet (600 mg total) by mouth every 6 (six) hours. 30 tablet 0  . naproxen (NAPROSYN) 500 MG tablet Take 1 tablet (500 mg total) by mouth 2 (two) times daily. 30 tablet 0  . Prenatal Multivit-Min-Fe-FA (PRENATAL VITAMINS) 0.8 MG tablet Take 1 tablet by mouth daily. 30 tablet 0  . senna-docusate (SENOKOT-S) 8.6-50 MG tablet Take 2 tablets by mouth daily. 30 tablet 0   No current facility-administered medications for this visit.    Review of Systems Review of Systems  Constitutional: Negative.   Respiratory: Negative.   Gastrointestinal: Positive for constipation (mild). Negative for anal bleeding.  Genitourinary: Positive for dyspareunia, pelvic pain and vaginal discharge. Negative for vaginal bleeding.  Musculoskeletal: Positive for back pain.    Blood pressure  107/73, pulse 87, height 5\' 2"  (1.575 m), weight 103 lb 8 oz (46.9 kg), last menstrual period 10/16/2020, unknown if currently breastfeeding.  Physical Exam Physical Exam Vitals and nursing note reviewed. Exam conducted with a chaperone present.  Constitutional:      Appearance: Normal appearance.  HENT:     Head: Normocephalic.  Pulmonary:     Effort: Pulmonary effort is normal.  Abdominal:     General: Abdomen is flat.     Palpations: Abdomen is soft.  Genitourinary:    General: Normal vulva.     Vagina: Vaginal discharge: white.     Comments: Mild CMT, uterus nl, no masses Musculoskeletal:     Cervical back: Normal range of motion.  Skin:    General: Skin is warm.  Neurological:     Mental Status: She is alert.  Psychiatric:        Mood and Affect: Mood normal.        Behavior: Behavior normal.     Data Reviewed Tests from 08/2020  Assessment Vaginal discharge - Plan: Cervicovaginal ancillary only( [place])  Pelvic pain in female - Plan: 09/2020 PELVIC COMPLETE WITH TRANSVAGINAL  May have MS component  Plan [place] as ordered, f/un on screen for infection, RTC for f/u    [place] 10/31/2020, 10:13 AM

## 2020-10-31 NOTE — Patient Instructions (Signed)
Pelvic Pain, Female Pelvic pain is pain in your lower belly (abdomen), below your belly button and between your hips. The pain may start suddenly (be acute), keep coming back (be recurring), or last a long time (become chronic). Pelvic pain that lasts longer than 6 months is called chronic pelvic pain. There are many causes of pelvic pain. Sometimes the cause of pelvic pain is not known. Follow these instructions at home:   Take over-the-counter and prescription medicines only as told by your doctor.  Rest as told by your doctor.  Do not have sex if it hurts.  Keep a journal of your pelvic pain. Write down: ? When the pain started. ? Where the pain is located. ? What seems to make the pain better or worse, such as food or your period (menstrual cycle). ? Any symptoms you have along with the pain.  Keep all follow-up visits as told by your doctor. This is important. Contact a doctor if:  Medicine does not help your pain.  Your pain comes back.  You have new symptoms.  You have unusual discharge or bleeding from your vagina.  You have a fever or chills.  You are having trouble pooping (constipation).  You have blood in your pee (urine) or poop (stool).  Your pee smells bad.  You feel weak or light-headed. Get help right away if:  You have sudden pain that is very bad.  Your pain keeps getting worse.  You have very bad pain and also have any of these symptoms: ? A fever. ? Feeling sick to your stomach (nausea). ? Throwing up (vomiting). ? Being very sweaty.  You pass out (lose consciousness). Summary  Pelvic pain is pain in your lower belly (abdomen), below your belly button and between your hips.  There are many possible causes of pelvic pain.  Keep a journal of your pelvic pain. This information is not intended to replace advice given to you by your health care provider. Make sure you discuss any questions you have with your health care provider. Document  Revised: 05/27/2018 Document Reviewed: 05/27/2018 Elsevier Patient Education  2020 Elsevier Inc.  

## 2020-10-31 NOTE — Progress Notes (Signed)
Here for c/o intermittent left pelvic pain for 1-1/2 years. - also sometimes left hip hurts. Had went to urgent care first. Also c/o spotting. States stopped depo-provera 6 months ago due to spotting. Brooke Bush

## 2020-10-31 NOTE — Progress Notes (Signed)
U/S scheduled for November 23rd @ 0900.  Pt notified.    Addison Naegeli, RN  10/31/20

## 2020-11-01 LAB — CERVICOVAGINAL ANCILLARY ONLY
Bacterial Vaginitis (gardnerella): NEGATIVE
Candida Glabrata: NEGATIVE
Candida Vaginitis: NEGATIVE
Chlamydia: NEGATIVE
Comment: NEGATIVE
Comment: NEGATIVE
Comment: NEGATIVE
Comment: NEGATIVE
Comment: NEGATIVE
Comment: NORMAL
Neisseria Gonorrhea: NEGATIVE
Trichomonas: POSITIVE — AB

## 2020-11-02 ENCOUNTER — Other Ambulatory Visit: Payer: Self-pay | Admitting: Obstetrics & Gynecology

## 2020-11-02 DIAGNOSIS — A599 Trichomoniasis, unspecified: Secondary | ICD-10-CM

## 2020-11-02 MED ORDER — METRONIDAZOLE 500 MG PO TABS: ORAL_TABLET | ORAL | 1 refills | Status: DC

## 2020-11-14 ENCOUNTER — Ambulatory Visit: Payer: Medicaid Other

## 2020-11-25 ENCOUNTER — Other Ambulatory Visit: Payer: Self-pay

## 2020-11-25 ENCOUNTER — Emergency Department (HOSPITAL_COMMUNITY)
Admission: EM | Admit: 2020-11-25 | Discharge: 2020-11-26 | Disposition: A | Discharge status: Home / Self Care | Payer: Medicaid Other | Attending: Emergency Medicine | Admitting: Emergency Medicine

## 2020-11-25 ENCOUNTER — Encounter (HOSPITAL_COMMUNITY): Payer: Self-pay | Admitting: Emergency Medicine

## 2020-11-25 DIAGNOSIS — R0602 Shortness of breath: Secondary | ICD-10-CM | POA: Diagnosis present

## 2020-11-25 DIAGNOSIS — R109 Unspecified abdominal pain: Secondary | ICD-10-CM | POA: Diagnosis not present

## 2020-11-25 DIAGNOSIS — F419 Anxiety disorder, unspecified: Secondary | ICD-10-CM | POA: Insufficient documentation

## 2020-11-25 DIAGNOSIS — Z5321 Procedure and treatment not carried out due to patient leaving prior to being seen by health care provider: Secondary | ICD-10-CM | POA: Diagnosis not present

## 2020-11-25 DIAGNOSIS — R5383 Other fatigue: Secondary | ICD-10-CM | POA: Insufficient documentation

## 2020-11-25 DIAGNOSIS — R42 Dizziness and giddiness: Secondary | ICD-10-CM | POA: Insufficient documentation

## 2020-11-25 DIAGNOSIS — R000 Tachycardia, unspecified: Secondary | ICD-10-CM | POA: Diagnosis not present

## 2020-11-25 LAB — URINALYSIS, ROUTINE W REFLEX MICROSCOPIC
Bilirubin Urine: NEGATIVE
Glucose, UA: NEGATIVE mg/dL
Ketones, ur: NEGATIVE mg/dL
Leukocytes,Ua: NEGATIVE
Nitrite: NEGATIVE
Protein, ur: NEGATIVE mg/dL
Specific Gravity, Urine: 1.003 — ABNORMAL LOW (ref 1.005–1.030)
pH: 8 (ref 5.0–8.0)

## 2020-11-25 LAB — COMPREHENSIVE METABOLIC PANEL
ALT: 13 U/L (ref 0–44)
AST: 19 U/L (ref 15–41)
Albumin: 3.9 g/dL (ref 3.5–5.0)
Alkaline Phosphatase: 47 U/L (ref 38–126)
Anion gap: 10 (ref 5–15)
BUN: 8 mg/dL (ref 6–20)
CO2: 26 mmol/L (ref 22–32)
Calcium: 9.9 mg/dL (ref 8.9–10.3)
Chloride: 102 mmol/L (ref 98–111)
Creatinine, Ser: 0.55 mg/dL (ref 0.44–1.00)
GFR, Estimated: >60 mL/min (ref >60)
Glucose, Bld: 96 mg/dL (ref 70–99)
Potassium: 3.8 mmol/L (ref 3.5–5.1)
Sodium: 138 mmol/L (ref 135–145)
Total Bilirubin: 0.8 mg/dL (ref 0.3–1.2)
Total Protein: 7.9 g/dL (ref 6.5–8.1)

## 2020-11-25 LAB — LIPASE, BLOOD: Lipase: 50 U/L (ref 11–51)

## 2020-11-25 LAB — CBC
HCT: 35.4 % — ABNORMAL LOW (ref 36.0–46.0)
Hemoglobin: 11 g/dL — ABNORMAL LOW (ref 12.0–15.0)
MCH: 21.8 pg — ABNORMAL LOW (ref 26.0–34.0)
MCHC: 31.1 g/dL (ref 30.0–36.0)
MCV: 70.2 fL — ABNORMAL LOW (ref 80.0–100.0)
Platelets: 410 10*3/uL — ABNORMAL HIGH (ref 150–400)
RBC: 5.04 MIL/uL (ref 3.87–5.11)
RDW: 15.4 % (ref 11.5–15.5)
WBC: 8 10*3/uL (ref 4.0–10.5)
nRBC: 0 % (ref 0.0–0.2)

## 2020-11-25 NOTE — ED Triage Notes (Signed)
Pt reports fast HR, SOB, dizziness, fatigue, and feeling faint that started today while at work.  Also reports intermittent abd pain that she states she was supposed to have endoscopy completed but decided not to.  Pt anxious.

## 2020-11-26 NOTE — ED Notes (Signed)
Patient states she is tired of waiting. Once this writer sat down to type this note noticed her name was slotted for a hallway bed but the patient already left.

## 2020-11-29 ENCOUNTER — Ambulatory Visit: Admission: RE | Admit: 2020-11-29 | Payer: Medicaid Other | Source: Ambulatory Visit

## 2020-12-07 ENCOUNTER — Ambulatory Visit: Payer: Medicaid Other | Admitting: Obstetrics & Gynecology

## 2021-02-08 ENCOUNTER — Other Ambulatory Visit: Payer: Self-pay

## 2021-02-08 ENCOUNTER — Encounter (HOSPITAL_COMMUNITY): Payer: Self-pay

## 2021-02-08 ENCOUNTER — Ambulatory Visit (HOSPITAL_COMMUNITY)
Admission: EM | Admit: 2021-02-08 | Discharge: 2021-02-08 | Disposition: A | Discharge status: Home / Self Care | Payer: Medicaid Other | Attending: Internal Medicine | Admitting: Internal Medicine

## 2021-02-08 ENCOUNTER — Emergency Department (HOSPITAL_COMMUNITY)
Admission: EM | Admit: 2021-02-08 | Discharge: 2021-02-08 | Disposition: A | Discharge status: Home / Self Care | Payer: Medicaid Other | Attending: Emergency Medicine | Admitting: Emergency Medicine

## 2021-02-08 ENCOUNTER — Emergency Department (HOSPITAL_COMMUNITY): Payer: Medicaid Other

## 2021-02-08 DIAGNOSIS — R82998 Other abnormal findings in urine: Secondary | ICD-10-CM | POA: Diagnosis not present

## 2021-02-08 DIAGNOSIS — R102 Pelvic and perineal pain: Secondary | ICD-10-CM | POA: Diagnosis present

## 2021-02-08 DIAGNOSIS — R1032 Left lower quadrant pain: Secondary | ICD-10-CM | POA: Insufficient documentation

## 2021-02-08 DIAGNOSIS — R21 Rash and other nonspecific skin eruption: Secondary | ICD-10-CM | POA: Insufficient documentation

## 2021-02-08 DIAGNOSIS — Z3202 Encounter for pregnancy test, result negative: Secondary | ICD-10-CM

## 2021-02-08 DIAGNOSIS — R350 Frequency of micturition: Secondary | ICD-10-CM | POA: Diagnosis not present

## 2021-02-08 DIAGNOSIS — M25552 Pain in left hip: Secondary | ICD-10-CM | POA: Insufficient documentation

## 2021-02-08 DIAGNOSIS — Z8742 Personal history of other diseases of the female genital tract: Secondary | ICD-10-CM

## 2021-02-08 DIAGNOSIS — M5432 Sciatica, left side: Secondary | ICD-10-CM

## 2021-02-08 LAB — POCT URINALYSIS DIPSTICK, ED / UC
Bilirubin Urine: NEGATIVE
Glucose, UA: NEGATIVE mg/dL
Ketones, ur: NEGATIVE mg/dL
Leukocytes,Ua: NEGATIVE
Nitrite: NEGATIVE
Protein, ur: NEGATIVE mg/dL
Specific Gravity, Urine: 1.025 (ref 1.005–1.030)
Urobilinogen, UA: 0.2 mg/dL (ref 0.0–1.0)
pH: 7 (ref 5.0–8.0)

## 2021-02-08 LAB — POC URINE PREG, ED: Preg Test, Ur: NEGATIVE

## 2021-02-08 MED ORDER — PHENAZOPYRIDINE HCL 200 MG PO TABS: 200.0000 mg | ORAL_TABLET | Freq: Three times a day (TID) | ORAL | 0 refills | Status: DC | PRN

## 2021-02-08 MED ORDER — TERBINAFINE HCL 1 % EX CREA: 1.0000 "application " | TOPICAL_CREAM | Freq: Two times a day (BID) | CUTANEOUS | 0 refills | Status: DC

## 2021-02-08 NOTE — Discharge Instructions (Signed)
You have been evaluated for your hip pain. X-ray of your hip is normal. Continue alternate between Tylenol and ibuprofen as needed for pain. For your urinary discomfort, you may take Pyridium to see if it helps. This medication may cause your urine to turn is orange. Apply Lamisil cream to your itchy rash twice daily for the next 3 weeks. If no improvement, call and follow-up with dermatologist for further care. Follow-up with your primary care doctor as well.

## 2021-02-08 NOTE — ED Provider Notes (Signed)
[facility] EMERGENCY DEPARTMENT Provider Note   CSN: [id number] Arrival date & time: 02/08/21  5397     History Chief Complaint  Patient presents with  . Hip Pain    Brooke Bush is a 30 y.o. female.  The history is provided by the patient and medical records. No language interpreter was used.  Hip Pain     30 year old G2P2 female sent here from urgent care center for evaluation of pelvic pain.  Patient reports she has had recurrent pain to her left groin region radiates to her left buttock, lower back and her leg.  Pain is been waxing waning but has been recurrent for the past year.  She also endorsed having some increased urinary frequency with malodorous.  She did try taking Azo last week without improvement.  She reports she has been seen and evaluated several times for this complaint and has had several pelvic exam.  At one point she was told that she test positive for trichomonas and was given antibiotic as treatment however it did not provide any relief.  She does not complain of any fever chills no bowel bladder incontinence or saddle anesthesia.  She denies any numbness.  She denies any trauma.  She mention going to urgent care today in hope of receiving some medication help here with her symptoms but was told to come to the ER for further work-up.  She does not want additional pelvic exam.  She denies any new sexual partner and states she use protection every single time.  Her last menstrual period was 10 days ago.  Patient does report having a recurrent rash to her body.  Rash is itchy, would last for several months and when resolved it leaves dark skin to the affected area.  She did try antifungal medication as well as cortisone cream intermittently with no relief.  She denies any environmental changes.  No report of fever or joint pain.  No headache.  Past Medical History:  Diagnosis Date  . Anemia   . External hemorrhoids   . GERD (gastroesophageal reflux  disease)   . Vaginal Pap smear, abnormal     Patient Active Problem List   Diagnosis Date Noted  . Pelvic pain in female 10/31/2020  . MENORRHALGIA 07/18/2008    Past Surgical History:  Procedure Laterality Date  . NO PAST SURGERIES    . WISDOM TOOTH EXTRACTION       OB History    Gravida  2   Para  2   Term  2   Preterm      AB      Living  2     SAB      IAB      Ectopic      Multiple  0   Live Births  2           No family history on file.  Social History   Tobacco Use  . Smoking status: Never Smoker  . Smokeless tobacco: Never Used  Vaping Use  . Vaping Use: Never used  Substance Use Topics  . Alcohol use: No  . Drug use: No    Home Medications Prior to Admission medications   Medication Sig Start Date End Date Taking? Authorizing Provider  ibuprofen (ADVIL,MOTRIN) 600 MG tablet Take 1 tablet (600 mg total) by mouth every 6 (six) hours. 01/02/19   [name], DO  metroNIDAZOLE (FLAGYL) 500 MG tablet Take two tablets by mouth twice a  day, for one day.  Or you can take all four tablets at once if you can tolerate it. 11/02/20   Adam Phenix, MD  naproxen (NAPROSYN) 500 MG tablet Take 1 tablet (500 mg total) by mouth 2 (two) times daily. 09/10/20   Georgetta Haber, NP  Prenatal Multivit-Min-Fe-FA (PRENATAL VITAMINS) 0.8 MG tablet Take 1 tablet by mouth daily. 05/17/18   Wurst, [place], PA-C  senna-docusate (SENOKOT-S) 8.6-50 MG tablet Take 2 tablets by mouth daily. 01/03/19   [name], DO    Allergies    Neutrogena lip moisturizer and Sunscreens  Review of Systems   Review of Systems  All other systems reviewed and are negative.   Physical Exam Updated Vital Signs BP 126/79 (BP Location: Left Arm)   Pulse (!) 109   Temp 97.8 F (36.6 C) (Oral)   Resp 16   LMP 01/29/2021 (Approximate)   SpO2 100%   Physical Exam Vitals and nursing note reviewed.  Constitutional:      General: She is not in acute distress.     Appearance: She is well-developed and well-nourished.  HENT:     Head: Atraumatic.  Eyes:     Conjunctiva/sclera: Conjunctivae normal.  Abdominal:     Palpations: Abdomen is soft.     Tenderness: There is no abdominal tenderness.  Genitourinary:    Comments: Pelvic examination is deferred Musculoskeletal:        General: Tenderness (Left hip: Tenderness along the left inguinal region, and left medial thigh as well as left lumbosacral region on palpation without any overlying skin changes or lymphadenopathy.  Normal hip flexion extension abduction and adduction.) present.     Cervical back: Neck supple.  Skin:    Findings: Rash present.     Comments: Patient with several small hyperpigmented skin lesions noted to bilateral lower extremities.  There is one lesion measuring approximately 2 cm with a ringlike structure couple with hyperpigmented central lesion.  It is not erythematous and nontender to palpation.  Neurological:     Mental Status: She is alert.  Psychiatric:        Mood and Affect: Mood and affect normal.     ED Results / Procedures / Treatments   Labs (all labs ordered are listed, but only abnormal results are displayed) Labs Reviewed - No data to display  EKG None  Radiology DG Hip Unilat With Pelvis 2-3 Views Left  Result Date: 02/08/2021 CLINICAL DATA:  Left lower quadrant pain EXAM: DG HIP (WITH OR WITHOUT PELVIS) 2-3V LEFT COMPARISON:  None. FINDINGS: There is no evidence of hip fracture or dislocation. There is no evidence of arthropathy or other focal bone abnormality. IMPRESSION: Negative. Electronically Signed   By: Marnee Spring M.D.   On: 02/08/2021 10:26    Procedures Procedures   Medications Ordered in ED Medications - No data to display  ED Course  I have reviewed the triage vital signs and the nursing notes.  Pertinent labs & imaging results that were available during my care of the patient were reviewed by me and considered in my medical  decision making (see chart for details).    MDM Rules/Calculators/A&P                          BP 102/64   Pulse 81   Temp 97.8 F (36.6 C) (Oral)   Resp 19   LMP 01/29/2021 (Approximate)   SpO2 100%   Final Clinical Impression(s) /  ED Diagnoses Final diagnoses:  Left hip pain  Rash and nonspecific skin eruption    Rx / DC Orders ED Discharge Orders         Ordered    phenazopyridine (PYRIDIUM) 200 MG tablet  3 times daily PRN        02/08/21 1329    terbinafine (LAMISIL AT) 1 % cream  2 times daily        02/08/21 1329         1:02 PM Patient here with recurrent left hip left inguinal pain ongoing for a year.  Pain is reproducible on exam.  X-ray of the left hip unremarkable.  Suspect pain to be musculoskeletal in nature.  Patient also report having some urinary discomfort along with pain.  She was seen at urgent care earlier today, UA obtained at that time is negative for UTI.  She did have a pelvic swab and results are currently pending.  Will defer further pelvic examination as it is unlikely to be PID given the chronicity of her symptoms.  Due to having urinary discomfort, it would be reasonable to try it Pyridium for symptomatic treatment as it may provide relief.  Patient also has several skin changes that is itchy and has been affecting her for nearly a year.  The rash does have appearance suggestive of fungal infection.  Will prescribe antifungal cream and dermatology referral.  At this time no acute emergent medical condition identified.   Fayrene Helper, PA-C 02/08/21 1331    Benjiman Core, MD 02/10/21 2050

## 2021-02-08 NOTE — ED Triage Notes (Signed)
Pt c/o recurrent LLQ abdominal pain/pelvic pain, pain to left lower back, down buttocks and anterior/medial left thigh intermittently for approx 1 week. Pain and difficulty with ambulation.  Also c/o pain on urination upon initiation, odor to urine, vaginal discharge white in color with malodor for approx 1 month.  Was evaluated by GYN in November.   Pt has been taking ibuprofen last taken 3 days ago. Took AZO last week w/o improvement.  Denies fever, n/v/d. Pt requesting "antibiotic pill for pelvic inflammatory disease, but I do not want to do the swab". Explained rationale for cyto swab and pt agreed to collect one.

## 2021-02-08 NOTE — ED Notes (Signed)
Patient is being discharged from the Urgent Care and sent to the Emergency Department via POV. Per L. Cheree Ditto, patient is in need of higher level of care due to abdominal/pelvic pain. Patient is aware and verbalizes understanding of plan of care.  Vitals:   02/08/21 0831  BP: 99/65  Pulse: 96  Resp: 18  Temp: 98.6 F (37 C)  SpO2: 100%

## 2021-02-08 NOTE — Discharge Instructions (Addendum)
-  Head straight to [facility] ED for further evaluation of abdominal/pelvic pain and back pain.  -If you experience worsening of abdominal pain, back pain, dizziness, chest pain, shortness of breath, weakness in your legs, etc. while on your way to the ER-stop and immediately call [phone]

## 2021-02-08 NOTE — ED Triage Notes (Addendum)
Patient complains of intermittent left hip pain that started a few years ago but became more intense this morning. Patient states the pain makes it difficult to walk and sit. Denies recent injury.  Patient has negative urine pregnancy test from today from Urgent Care.

## 2021-02-08 NOTE — ED Provider Notes (Addendum)
[facility]    CSN: [id number] Arrival date & time: 02/08/21  0813      History   Chief Complaint Chief Complaint  Patient presents with  . Abdominal Pain    HPI Brooke Bush is a 30 y.o. female presenting with abdominal pain and back pain with sciatica. History anemia, hemorrhoids, GERD, pelvic pian, menorrhagia.  -Pt c/o recurrent LLQ abdominal pain/pelvic pain, urinary frequency, malodorous white vaginal discharge. She is already followed by gyn for this and has an [place] scheduled for 1 week from now. Was evaluated by GYN in November and was treated for trichomonas/PID at that time; however states symptoms have persisted nonetheless.  Also c/o dyrusia on urination upon initiation, odor to urine, suprapubic pressure. Denies new partners since that visit 11/21. Pt has been taking ibuprofen last taken 3 days ago. Took AZO last week w/o improvement. Denies fevers, n/v/d. Last period was 2-3 weeks ago.  -Also with pain to left lower back, down buttocks and anterior/medial left thigh intermittently for approx 1 week. Pain and difficulty with ambulation. Denies trauma but states she sits a lot for work. Denies  denies numbness in arms/legs, denies weakness in arms/legs, denies saddle anesthesia, denies bowel/bladder incontinence.    HPI  Past Medical History:  Diagnosis Date  . Anemia   . External hemorrhoids   . GERD (gastroesophageal reflux disease)   . Vaginal Pap smear, abnormal     Patient Active Problem List   Diagnosis Date Noted  . Pelvic pain in female 10/31/2020  . MENORRHALGIA 07/18/2008    Past Surgical History:  Procedure Laterality Date  . NO PAST SURGERIES    . WISDOM TOOTH EXTRACTION      OB History    Gravida  2   Para  2   Term  2   Preterm      AB      Living  2     SAB      IAB      Ectopic      Multiple  0   Live Births  2            Home Medications    Prior to Admission medications   Medication Sig Start Date End  Date Taking? Authorizing Provider  ibuprofen (ADVIL,MOTRIN) 600 MG tablet Take 1 tablet (600 mg total) by mouth every 6 (six) hours. 01/02/19  Yes Peggyann Shoals C, DO  metroNIDAZOLE (FLAGYL) 500 MG tablet Take two tablets by mouth twice a day, for one day.  Or you can take all four tablets at once if you can tolerate it. 11/02/20   Adam Phenix, MD  naproxen (NAPROSYN) 500 MG tablet Take 1 tablet (500 mg total) by mouth 2 (two) times daily. 09/10/20   Georgetta Haber, NP  Prenatal Multivit-Min-Fe-FA (PRENATAL VITAMINS) 0.8 MG tablet Take 1 tablet by mouth daily. 05/17/18   Wurst, [place], PA-C  senna-docusate (SENOKOT-S) 8.6-50 MG tablet Take 2 tablets by mouth daily. 01/03/19   [name], DO    Family History History reviewed. No pertinent family history.  Social History Social History   Tobacco Use  . Smoking status: Never Smoker  . Smokeless tobacco: Never Used  Vaping Use  . Vaping Use: Never used  Substance Use Topics  . Alcohol use: No  . Drug use: No     Allergies   Neutrogena lip moisturizer and Sunscreens   Review of Systems Review of Systems  Constitutional: Negative for chills  and fever.  HENT: Negative for sore throat.   Eyes: Negative for pain and redness.  Respiratory: Negative for shortness of breath.   Cardiovascular: Negative for chest pain.  Gastrointestinal: Positive for abdominal pain. Negative for diarrhea, nausea and vomiting.  Genitourinary: Positive for dysuria, frequency, pelvic pain, vaginal discharge and vaginal pain. Negative for decreased urine volume, difficulty urinating, flank pain, genital sores, hematuria, menstrual problem, urgency and vaginal bleeding.  Musculoskeletal: Positive for back pain.  Skin: Negative for rash.  All other systems reviewed and are negative.    Physical Exam Triage Vital Signs ED Triage Vitals  Enc Vitals Group     BP 02/08/21 0831 99/65     Pulse Rate 02/08/21 0831 96     Resp 02/08/21 0831 18      Temp 02/08/21 0831 98.6 F (37 C)     Temp Source 02/08/21 0831 Oral     SpO2 02/08/21 0831 100 %     Weight --      Height --      Head Circumference --      Peak Flow --      Pain Score 02/08/21 0828 9     Pain Loc --      Pain Edu? --      Excl. in GC? --    No data found.  Updated Vital Signs BP 99/65 (BP Location: Left Arm)   Pulse 96   Temp 98.6 F (37 C) (Oral)   Resp 18   LMP 01/29/2021 (Approximate)   SpO2 100%   Visual Acuity Right Eye Distance:   Left Eye Distance:   Bilateral Distance:    Right Eye Near:   Left Eye Near:    Bilateral Near:     Physical Exam Vitals reviewed.  Constitutional:      General: She is not in acute distress.    Appearance: Normal appearance. She is not ill-appearing.  HENT:     Head: Normocephalic and atraumatic.  Cardiovascular:     Rate and Rhythm: Normal rate and regular rhythm.     Heart sounds: Normal heart sounds.  Pulmonary:     Effort: Pulmonary effort is normal.     Breath sounds: Normal breath sounds. No wheezing, rhonchi or rales.  Abdominal:     General: Bowel sounds are normal. There is no distension.     Palpations: Abdomen is soft. There is no mass.     Tenderness: There is abdominal tenderness in the left lower quadrant. There is guarding. There is no right CVA tenderness, left CVA tenderness or rebound. Positive signs include Rovsing's sign. Negative signs include Murphy's sign and McBurney's sign.  Musculoskeletal:     Comments: L-sided lumbar paraspinous muscle tenderness to deep palpation, no spinous tenderness or deformity. Positive L straight leg raise. Walks stiffly favoring L leg. Strength 5/5 in UEs and sensation intact.   Neurological:     General: No focal deficit present.     Mental Status: She is alert and oriented to person, place, and time.  Psychiatric:        Mood and Affect: Mood normal.        Behavior: Behavior normal.      UC Treatments / Results  Labs (all labs ordered are  listed, but only abnormal results are displayed) Labs Reviewed  POCT URINALYSIS DIPSTICK, ED / UC - Abnormal; Notable for the following components:      Result Value   Hgb urine dipstick SMALL (*)  All other components within normal limits  POC URINE PREG, ED  CERVICOVAGINAL ANCILLARY ONLY    EKG   Radiology No results found.  Procedures Procedures (including critical care time)  Medications Ordered in UC Medications - No data to display  Initial Impression / Assessment and Plan / UC Course  I have reviewed the triage vital signs and the nursing notes.  Pertinent labs & imaging results that were available during my care of the patient were reviewed by me and considered in my medical decision making (see chart for details).     This patient is a 30 year old female presenting with recurrent pelvic pain, left lower quadrant pain, and new back pain with sciatica.  She is already followed by GYN for this, and has an ultrasound scheduled for 1 week from today.  However she is presenting with continued pain, and states that she needs answers today.  On exam, 8 out of 10 pain to palpation left lower quadrant.  She also endorses new weakness and stiffness in her legs, and difficulty walking due to this.  DDx is ovarian cyst vs PID vs other.  I am sending this patient to [facility], ER for further evaluation of abdominal and back pain.  Will send for G/C, trich, yeast, BV testing. Will defer further workup to ED.  Spent over 40 minutes obtaining H&P, performing physical, discussing results, treatment plan and plan for follow-up with patient. Patient agrees with plan.   This chart was dictated using voice recognition software, Dragon. Despite the best efforts of this provider to proofread and correct errors, errors may still occur which can change documentation meaning.   Final Clinical Impressions(s) / UC Diagnoses   Final diagnoses:  Pelvic pain  LLQ pain  Sciatica of left side   History of acute PID     Discharge Instructions     -Head straight to [facility] ED for further evaluation of abdominal/pelvic pain and back pain.  -If you experience worsening of abdominal pain, back pain, dizziness, chest pain, shortness of breath, weakness in your legs, etc. while on your way to the ER-stop and immediately call [phone]    ED Prescriptions    None     PDMP not reviewed this encounter.   Rhys Martini, PA-C 02/08/21 1036    Rhys Martini, PA-C 02/08/21 1037

## 2021-02-09 LAB — CERVICOVAGINAL ANCILLARY ONLY
Bacterial Vaginitis (gardnerella): NEGATIVE
Candida Glabrata: NEGATIVE
Candida Vaginitis: NEGATIVE
Chlamydia: NEGATIVE
Comment: NEGATIVE
Comment: NEGATIVE
Comment: NEGATIVE
Comment: NEGATIVE
Comment: NEGATIVE
Comment: NORMAL
Neisseria Gonorrhea: NEGATIVE
Trichomonas: NEGATIVE

## 2021-02-14 ENCOUNTER — Other Ambulatory Visit: Payer: Self-pay

## 2021-02-14 ENCOUNTER — Ambulatory Visit
Admission: RE | Admit: 2021-02-14 | Discharge: 2021-02-14 | Disposition: A | Discharge status: Home / Self Care | Payer: Medicaid Other | Source: Ambulatory Visit | Attending: Obstetrics and Gynecology | Admitting: Obstetrics and Gynecology

## 2021-02-14 DIAGNOSIS — R102 Pelvic and perineal pain: Secondary | ICD-10-CM | POA: Diagnosis not present

## 2021-05-23 ENCOUNTER — Ambulatory Visit (HOSPITAL_COMMUNITY)
Admission: EM | Admit: 2021-05-23 | Discharge: 2021-05-23 | Disposition: A | Discharge status: Home / Self Care | Payer: Medicaid Other | Attending: Family | Admitting: Family

## 2021-05-23 ENCOUNTER — Encounter (HOSPITAL_COMMUNITY): Payer: Self-pay

## 2021-05-23 ENCOUNTER — Other Ambulatory Visit: Payer: Self-pay

## 2021-05-23 DIAGNOSIS — R21 Rash and other nonspecific skin eruption: Secondary | ICD-10-CM

## 2021-05-23 DIAGNOSIS — B359 Dermatophytosis, unspecified: Secondary | ICD-10-CM

## 2021-05-23 MED ORDER — FLUCONAZOLE 150 MG PO TABS
150.0000 mg | ORAL_TABLET | Freq: Every day | ORAL | 0 refills | Status: AC
Start: 2021-05-23 — End: 2021-06-13

## 2021-05-23 NOTE — ED Triage Notes (Signed)
Pt in with c/o rash that has been going on for awhile. Rash is on her neck, torso and leg  States she has been seeing pcp but the otc ointment they recommended is not working

## 2021-05-23 NOTE — ED Provider Notes (Signed)
[facility]    CSN: [id number] Arrival date & time: 05/23/21  4481      History   Chief Complaint Chief Complaint  Patient presents with  . Rash    HPI Brooke Bush is a 30 y.o. female.   30 year old female presents with rash that continues to spread. Started about 5 to 6 months ago with one spot on her right arm. Then she developed other oval to round darker spots with slight clearing in center and raised edges on her other arms, torso, back, neck and legs. No lesions on her face, hands or feet. Initially itchy. Was seen by her PCP a few months ago and dx with probable tinea and told to use OTC antifungal cream. Rash improved slightly but returned and now leaves darker discoloration of her skin. She went to ER (for pelvic pain but mentioned rash) in Feb 2022 and was prescribed Rx strength topical Lamisil. She has used this and again, some improvement but lesions continue to occur. She was referred to a Dermatologist at the ER but due to Medicaid, she needs to see her PCP for referral. Her next available appointment with her PCP is not until August and she wants additional treatment now. She does have sensitive skin and is allergic to many OTC lotions and sunscreens. Other chronic health issues include anemia, painful periods and intermittent pelvic pain. Takes no other daily medication.   The history is provided by the patient.    Past Medical History:  Diagnosis Date  . Anemia   . External hemorrhoids   . GERD (gastroesophageal reflux disease)   . Vaginal Pap smear, abnormal     Patient Active Problem List   Diagnosis Date Noted  . Pelvic pain in female 10/31/2020  . MENORRHALGIA 07/18/2008    Past Surgical History:  Procedure Laterality Date  . NO PAST SURGERIES    . WISDOM TOOTH EXTRACTION      OB History    Gravida  2   Para  2   Term  2   Preterm      AB      Living  2     SAB      IAB      Ectopic      Multiple  0   Live Births  2             Home Medications    Prior to Admission medications   Medication Sig Start Date End Date Taking? Authorizing Provider  fluconazole (DIFLUCAN) 150 MG tablet Take 1 tablet (150 mg total) by mouth daily for 21 days. 05/23/21 06/13/21 Yes Juanetta Negash, Ali Lowe, NP  Prenatal Multivit-Min-Fe-FA (PRENATAL VITAMINS) 0.8 MG tablet Take 1 tablet by mouth daily. 05/17/18   Wurst, [place], PA-C  senna-docusate (SENOKOT-S) 8.6-50 MG tablet Take 2 tablets by mouth daily. 01/03/19   Peggyann Shoals C, DO  terbinafine (LAMISIL AT) 1 % cream Apply 1 application topically 2 (two) times daily. 02/08/21   Fayrene Helper, PA-C    Family History History reviewed. No pertinent family history.  Social History Social History   Tobacco Use  . Smoking status: Never Smoker  . Smokeless tobacco: Never Used  Vaping Use  . Vaping Use: Never used  Substance Use Topics  . Alcohol use: No  . Drug use: No     Allergies   Neutrogena lip moisturizer and Sunscreens   Review of Systems Review of Systems  Constitutional: Negative for activity change,  appetite change, chills, fatigue and fever.  HENT: Negative for mouth sores and sore throat.   Eyes: Negative for photophobia and visual disturbance.  Respiratory: Negative for chest tightness, shortness of breath and wheezing.   Gastrointestinal: Negative for nausea and vomiting.  Genitourinary: Positive for menstrual problem (painful periods) and pelvic pain (intermittent- recent pelvic U/S was normal). Negative for difficulty urinating, flank pain and hematuria.  Musculoskeletal: Negative for arthralgias, myalgias, neck pain and neck stiffness.  Skin: Positive for color change and rash. Negative for pallor.  Allergic/Immunologic: Positive for environmental allergies. Negative for food allergies and immunocompromised state.  Neurological: Negative for dizziness, tremors, seizures, syncope, weakness, light-headedness, numbness and headaches.  Hematological:  Negative for adenopathy. Does not bruise/bleed easily.     Physical Exam Triage Vital Signs ED Triage Vitals  Enc Vitals Group     BP 05/23/21 0829 100/69     Pulse Rate 05/23/21 0829 79     Resp 05/23/21 0829 17     Temp 05/23/21 0829 97.9 F (36.6 C)     Temp Source 05/23/21 0829 Oral     SpO2 05/23/21 0829 98 %     Weight --      Height --      Head Circumference --      Peak Flow --      Pain Score 05/23/21 0828 0     Pain Loc --      Pain Edu? --      Excl. in GC? --    No data found.  Updated Vital Signs BP 100/69 (BP Location: Left Arm)   Pulse 79   Temp 97.9 F (36.6 C) (Oral)   Resp 17   LMP 05/18/2021 (Approximate)   SpO2 98%   Breastfeeding No   Visual Acuity Right Eye Distance:   Left Eye Distance:   Bilateral Distance:    Right Eye Near:   Left Eye Near:    Bilateral Near:     Physical Exam Vitals and nursing note reviewed.  Constitutional:      General: She is awake. She is not in acute distress.    Appearance: She is well-developed and well-groomed. She is not ill-appearing.     Comments: She is sitting comfortably on the exam table in no acute distress.   HENT:     Head: Normocephalic and atraumatic.      Comments: Darker more oval flat discoloration of skin on neck just below chin. No other lesions or rash seen on her neck.     Right Ear: Hearing normal.     Left Ear: Hearing normal.     Nose: Nose normal.     Mouth/Throat:     Lips: Pink.     Mouth: Mucous membranes are moist.     Pharynx: Oropharynx is clear. Uvula midline.  Eyes:     General: No scleral icterus.    Extraocular Movements: Extraocular movements intact.     Conjunctiva/sclera: Conjunctivae normal.  Cardiovascular:     Rate and Rhythm: Normal rate.  Pulmonary:     Effort: Pulmonary effort is normal.  Musculoskeletal:        General: Normal range of motion.     Cervical back: Normal range of motion and neck supple. No tenderness.  Lymphadenopathy:     Cervical:  No cervical adenopathy.  Skin:    General: Skin is warm and dry.     Capillary Refill: Capillary refill takes less than 2 seconds.  Coloration: Skin is not jaundiced or mottled.     Findings: Rash present. No acne, bruising, ecchymosis, erythema, petechiae or wound. Rash is macular. Rash is not crusting, purpuric, pustular, scaling, urticarial or vesicular.          Comments: Scattered darker red oval lesions seen on right forearm, lower abdomen, right upper thigh and left torso- harder to distinguish due to darker skin color at baseline. Mostly macular with one lesion with raised borders. No scaling. Other areas of darker discoloration on neck, arms, upper legs and torso where previous lesions were located. No loss of skin pigment. No crusting or plaques. No discharge.   Neurological:     General: No focal deficit present.     Mental Status: She is alert and oriented to person, place, and time.     Sensory: Sensation is intact. No sensory deficit.     Motor: Motor function is intact.  Psychiatric:        Attention and Perception: Attention normal.        Mood and Affect: Affect normal. Mood is anxious.        Speech: Speech is rapid and pressured.        Behavior: Behavior is cooperative.        Thought Content: Thought content normal.        Cognition and Memory: Cognition normal.        Judgment: Judgment normal.      UC Treatments / Results  Labs (all labs ordered are listed, but only abnormal results are displayed) Labs Reviewed - No data to display  EKG   Radiology No results found.  Procedures Procedures (including critical care time)  Medications Ordered in UC Medications - No data to display  Initial Impression / Assessment and Plan / UC Course  I have reviewed the triage vital signs and the nursing notes.  Pertinent labs & imaging results that were available during my care of the patient were reviewed by me and considered in my medical decision making (see  chart for details).    Reviewed with patient that her lesions and rash do appear to be fungal in nature. Unable to do KOH or other confirmatory fungal skin testing here at Urgent Care. Discussed that she really needs to see a Dermatologist for further evaluation. However, since she tried topical antifungals with limited success, will trial an oral antifungal for 3 to 4 weeks until she can see her PCP for referral to a Specialist if lesions persist and to discuss ways to lighten skin tone. Reviewed labs from ER visit in December 2021 and liver enzymes were normal. Medicaid will not cover Itraconazole and patient was on topical Terbinafine so will trial Fluconazole 150mg  (Medicaid won't cover 200mg ) daily for 3 weeks. Reviewed that skin often darkens in darker complexions with tinea but will slowly lighten over time. Continue to avoid other topical creams and agents which have caused rashes and irritation in the past. Follow-up with your PCP as planned in August so that they can refer you to a Dermatologist for further evaluation.  Final Clinical Impressions(s) / UC Diagnoses   Final diagnoses:  Rash and nonspecific skin eruption  Tinea     Discharge Instructions     Recommend start oral Diflucan 150mg  once daily for 3 weeks. Skin discoloration often occurs with this type of infection and should slowly fade over time. Recommend go see a Dermatologist. Since you need a referral from your PCP due to your insurance, make  sure you ask your PCP when you see them at your next appointment for a referral to a Dermatologist for further evaluation.     ED Prescriptions    Medication Sig Dispense Auth. Provider   fluconazole (DIFLUCAN) 150 MG tablet Take 1 tablet (150 mg total) by mouth daily for 21 days. 21 tablet Sheryl Saintil, Ali Lowe, NP     PDMP not reviewed this encounter.   Sudie Grumbling, NP 05/24/21 1630

## 2021-05-23 NOTE — Discharge Instructions (Addendum)
Recommend start oral Diflucan 150mg  once daily for 3 weeks. Skin discoloration often occurs with this type of infection and should slowly fade over time. Recommend go see a Dermatologist. Since you need a referral from your PCP due to your insurance, make sure you ask your PCP when you see them at your next appointment for a referral to a Dermatologist for further evaluation.

## 2021-07-25 ENCOUNTER — Ambulatory Visit: Payer: Medicaid Other | Admitting: Family

## 2021-07-31 ENCOUNTER — Other Ambulatory Visit: Payer: Self-pay

## 2021-07-31 ENCOUNTER — Encounter: Payer: Self-pay | Admitting: Family Medicine

## 2021-07-31 ENCOUNTER — Ambulatory Visit: Payer: Medicaid Other | Admitting: Family Medicine

## 2021-07-31 VITALS — BP 96/74 | HR 90 | Temp 97.9°F | Resp 18 | Ht 62.01 in | Wt 103.2 lb

## 2021-07-31 DIAGNOSIS — Z7689 Persons encountering health services in other specified circumstances: Secondary | ICD-10-CM

## 2021-07-31 DIAGNOSIS — M792 Neuralgia and neuritis, unspecified: Secondary | ICD-10-CM | POA: Diagnosis not present

## 2021-07-31 DIAGNOSIS — R21 Rash and other nonspecific skin eruption: Secondary | ICD-10-CM | POA: Diagnosis not present

## 2021-07-31 MED ORDER — TRIAMCINOLONE ACETONIDE 0.1 % EX CREA: 1.0000 "application " | TOPICAL_CREAM | Freq: Two times a day (BID) | CUTANEOUS | 0 refills | Status: DC

## 2021-07-31 MED ORDER — GABAPENTIN 300 MG PO CAPS: 300.0000 mg | ORAL_CAPSULE | Freq: Every day | ORAL | 0 refills | Status: DC

## 2021-07-31 NOTE — Progress Notes (Signed)
New Patient Office Visit  Subjective:  Patient ID: Brooke Bush, female    DOB: 1991-07-25  Age: 30 y.o. MRN: [id number]  CC:  Chief Complaint  Patient presents with   Hip Pain    HPI Jasemine Nawaz Drewes presents for to establish care.  Patient has 2 main concerns.First concern is of left-sided hip pain.  This pain came after having a baby and has not resolved but has not really worsened.  She has been taking Tylenol and ibuprofen for the symptoms which have provided some minimal relief.  She denies any weakness.  She reports the symptoms like a numbness or tingling down the left lower extremity.  The second concern is a rash on the front of her neck.  She has been using an antifungal without resolution.  She reports that the area is itchy.  She denies known exposures.  Past Medical History:  Diagnosis Date   Anemia    External hemorrhoids    GERD (gastroesophageal reflux disease)    Vaginal Pap smear, abnormal     Past Surgical History:  Procedure Laterality Date   NO PAST SURGERIES     WISDOM TOOTH EXTRACTION      History reviewed. No pertinent family history.  Social History   Socioeconomic History   Marital status: Significant Other    Spouse name: Not on file   Number of children: Not on file   Years of education: Not on file   Highest education level: Not on file  Occupational History   Not on file  Tobacco Use   Smoking status: Never   Smokeless tobacco: Never  Vaping Use   Vaping Use: Never used  Substance and Sexual Activity   Alcohol use: No   Drug use: No   Sexual activity: Yes    Birth control/protection: Condom  Other Topics Concern   Not on file  Social History Narrative   Not on file   Social Determinants of Health   Financial Resource Strain: Not on file  Food Insecurity: No Food Insecurity   Worried About Running Out of Food in the Last Year: Never true   Ran Out of Food in the Last Year: Never true  Transportation Needs: No Transportation Needs    Lack of Transportation (Medical): No   Lack of Transportation (Non-Medical): No  Physical Activity: Not on file  Stress: Not on file  Social Connections: Not on file  Intimate Partner Violence: Not on file    ROS Review of Systems  Skin:  Positive for rash.  Neurological:  Positive for numbness.  All other systems reviewed and are negative.  Objective:   Today's Vitals: BP 96/74 (BP Location: Left Arm, Patient Position: Sitting, Cuff Size: Normal)   Pulse 90   Temp 97.9 F (36.6 C)   Resp 18   Ht 5' 2.01" (1.575 m)   Wt 103 lb 3.2 oz (46.8 kg)   SpO2 98%   BMI 18.87 kg/m   Physical Exam Vitals and nursing note reviewed.  Constitutional:      General: She is not in acute distress. Cardiovascular:     Rate and Rhythm: Normal rate and regular rhythm.  Pulmonary:     Effort: Pulmonary effort is normal.     Breath sounds: Normal breath sounds.  Abdominal:     Palpations: Abdomen is soft.     Tenderness: There is no abdominal tenderness.  Musculoskeletal:        General: No swelling, deformity or signs  of injury. Normal range of motion.  Skin:    Findings: Rash present.  Neurological:     General: No focal deficit present.     Mental Status: She is alert and oriented to person, place, and time.     Motor: No weakness.    Assessment & Plan:   Problem List Items Addressed This Visit   None Visit Diagnoses     Neuralgia    -  Primary   will prescribe neurontin for qhs and monitor   Relevant Medications   gabapentin (NEURONTIN) 300 MG capsule   Rash and nonspecific skin eruption       triamcinolone cream prescribed - consider referral to derm if sx persist or worsen   Relevant Medications   triamcinolone cream (KENALOG) 0.1 %   Encounter to establish care           Outpatient Encounter Medications as of 07/31/2021  Medication Sig   [DISCONTINUED] Prenatal Multivit-Min-Fe-FA (PRENATAL VITAMINS) 0.8 MG tablet Take 1 tablet by mouth daily.   [DISCONTINUED]  senna-docusate (SENOKOT-S) 8.6-50 MG tablet Take 2 tablets by mouth daily.   [DISCONTINUED] terbinafine (LAMISIL AT) 1 % cream Apply 1 application topically 2 (two) times daily.   No facility-administered encounter medications on file as of 07/31/2021.    Follow-up: Return in about 6 weeks (around 09/11/2021) for follow up.   Tommie Raymond, MD

## 2021-07-31 NOTE — Progress Notes (Signed)
Pt presents to establish care and for recurrent pain to her left hip that starts in groin region that radiates to her left buttock, lower back and her leg symptoms include leg numbness, limited mobility. Wants a CT scan for further evaluation. Ultrasound and xray were normal Recurrent rash on neck causing itching and redness

## 2021-08-02 ENCOUNTER — Ambulatory Visit: Payer: Medicaid Other | Admitting: Obstetrics & Gynecology

## 2021-08-23 IMAGING — US US PELVIS COMPLETE WITH TRANSVAGINAL
1 series · 15 of 25 positions shown · non-contrast
Comparison: None

CLINICAL DATA: Pelvic pain in a female, LEFT lower quadrant pain,
LMP 01/29/2021 none, G2P2

EXAM:
TRANSABDOMINAL AND TRANSVAGINAL ULTRASOUND OF PELVIS
TECHNIQUE: Both transabdominal and transvaginal ultrasound examinations of the
pelvis were performed. Transabdominal technique was performed for
global imaging of the pelvis including uterus, ovaries, adnexal
regions, and pelvic cul-de-sac. It was necessary to proceed with
endovaginal exam following the transabdominal exam to visualize the
adnexa and endometrium.

[Series 1: us pelvis complete with transvaginal · 15 of 120 slices shown]
[im 1/120]
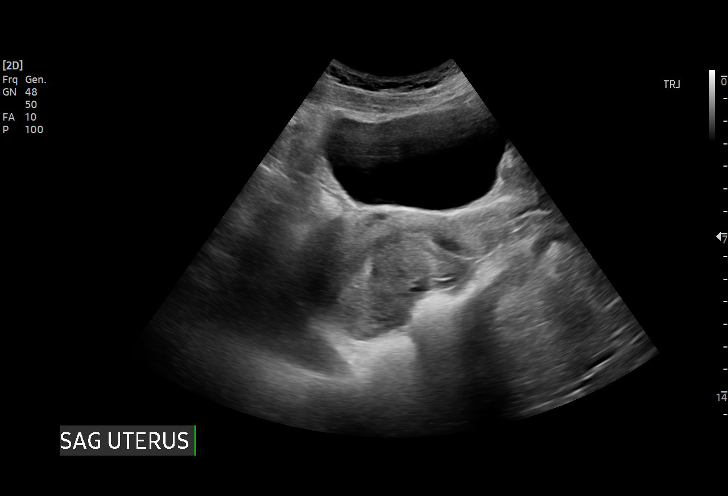
[im 10/120]
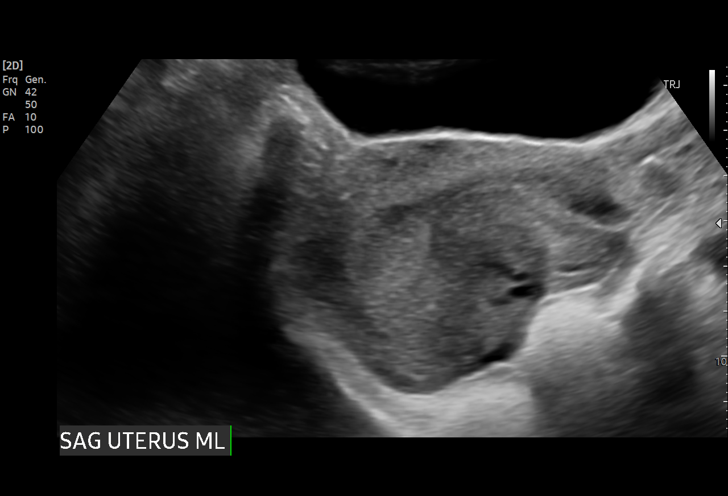
[im 20/120]
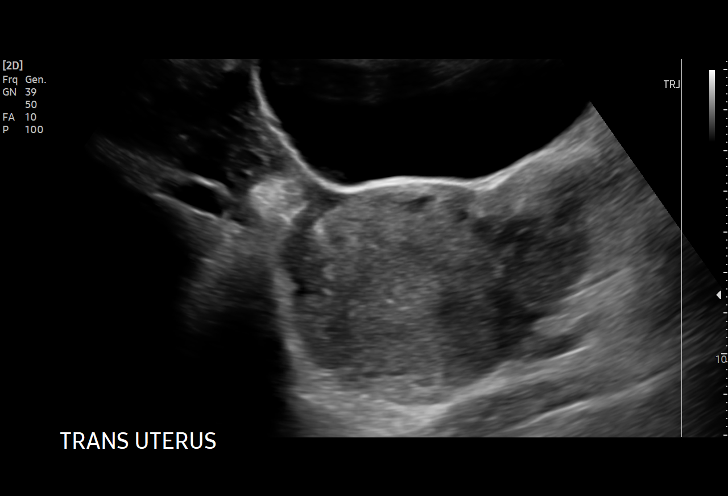
[im 25/120]
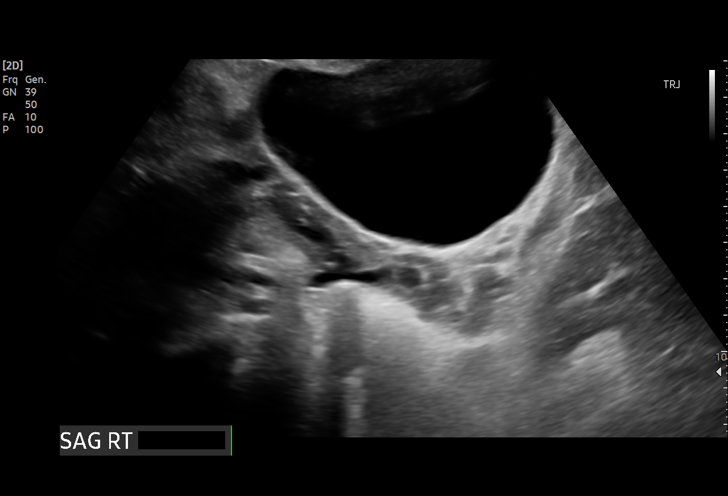
[im 35/120]
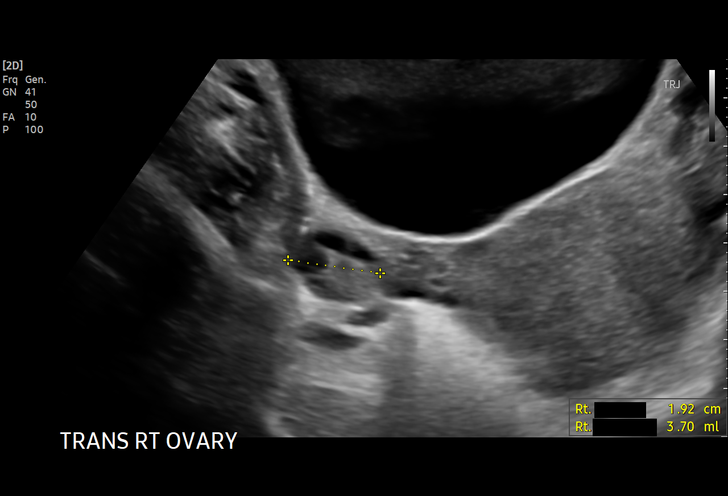
[im 45/120]
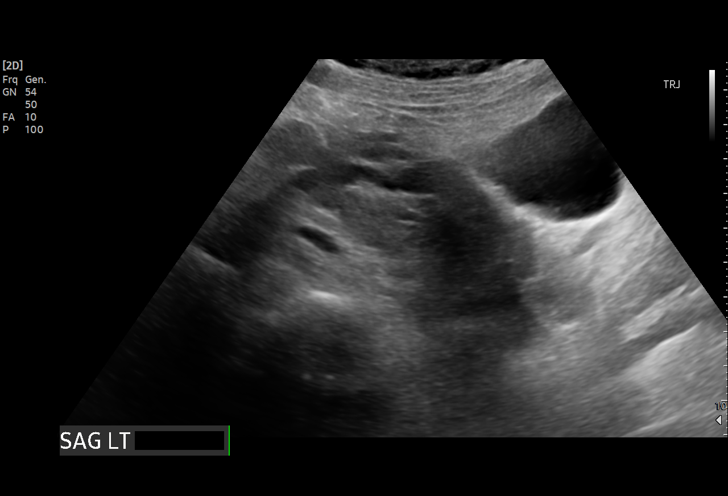
[im 50/120]
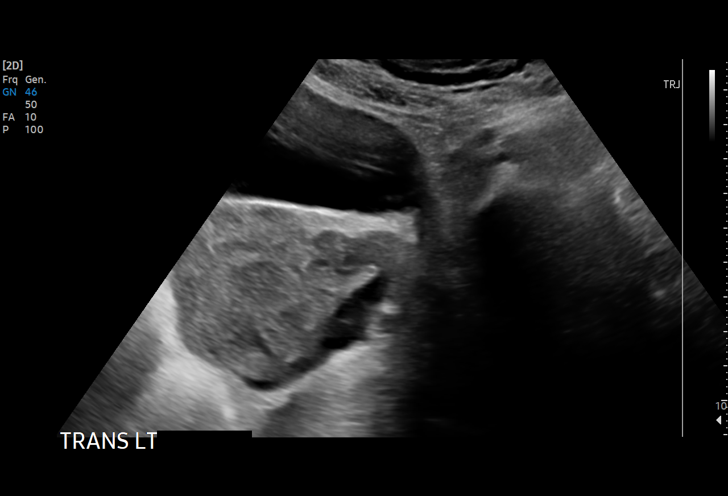
[im 60/120]
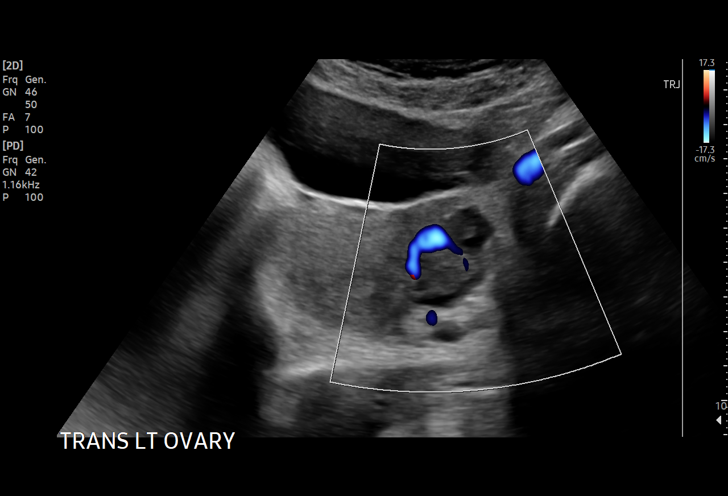
[im 70/120]
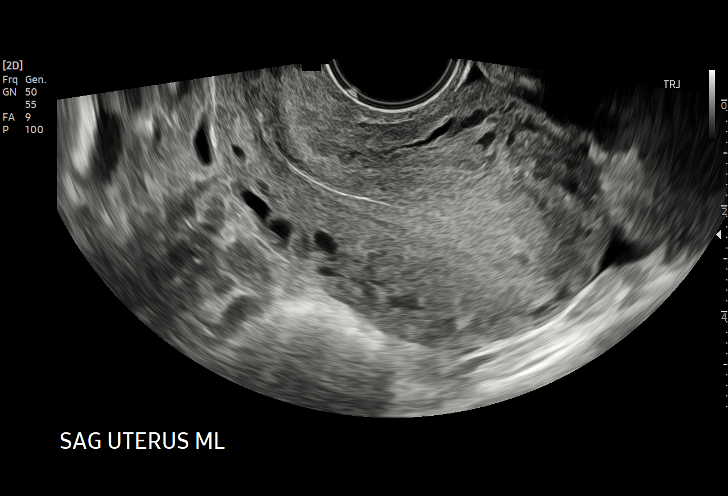
[im 75/120]
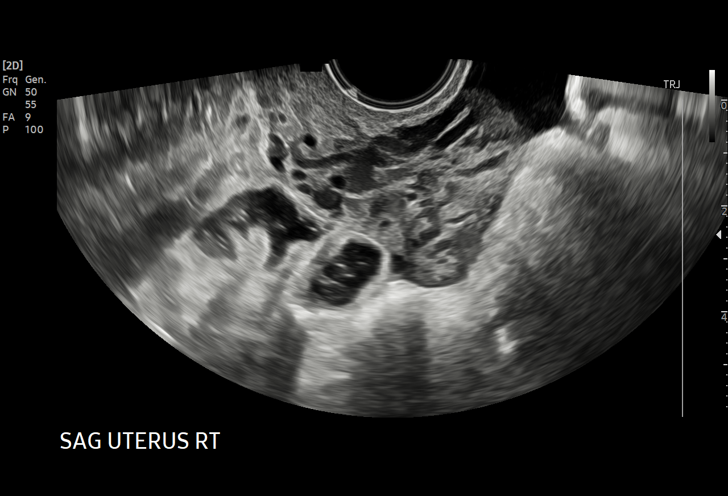
[im 85/120]
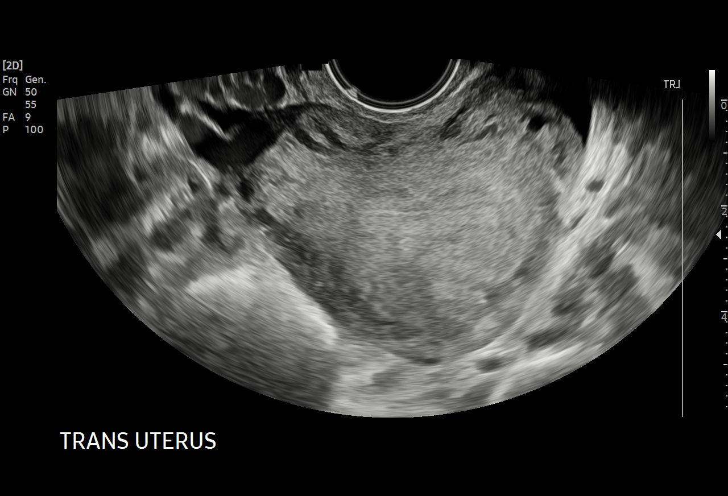
[im 95/120]
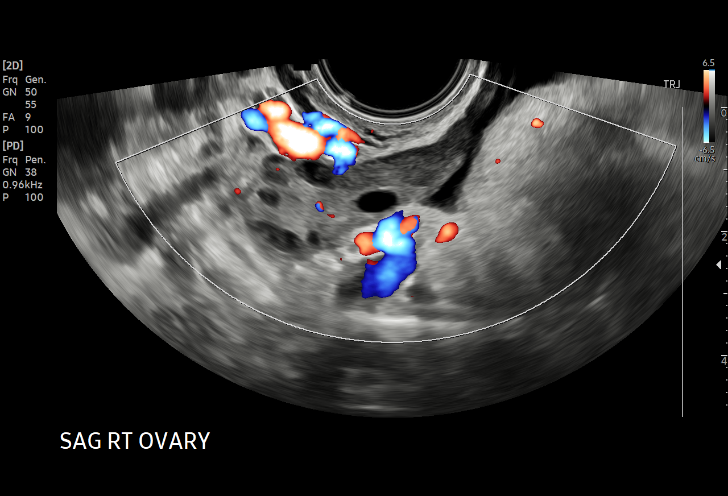
[im 100/120]
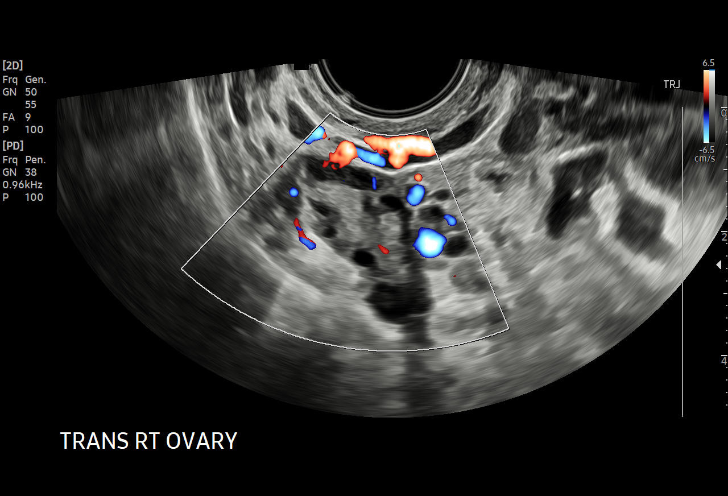
[im 110/120]
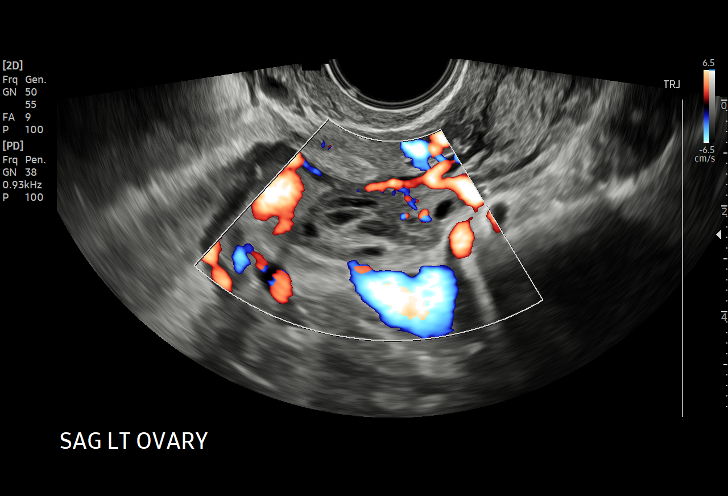
[im 120/120]
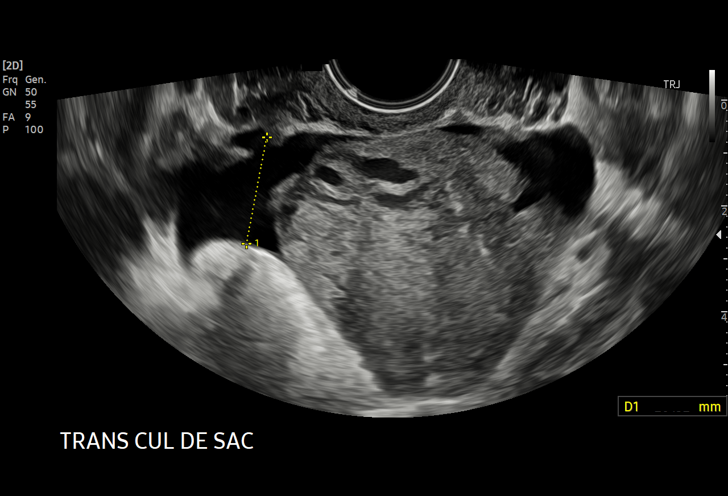

[15 of 25 positions shown; findings below may reference images not displayed]

FINDINGS: Uterus

Measurements: 8.6 x 4.6 x 5.6 cm = volume: 116 mL. Retroverted.
Normal morphology without mass

Endometrium

Thickness: 9 mm.  No endometrial fluid or focal abnormality

Right ovary

Measurements: 4.1 x 1.9 x 1.9 cm = volume: 7.9 mL. Normal morphology
without mass

Left ovary

Measurements: 3.8 x 1.7 x 1.8 cm = volume: 6.1 mL. Normal morphology
without mass

Other findings

Trace free pelvic fluid.  No adnexal masses.
IMPRESSION: Normal exam.

## 2021-09-11 ENCOUNTER — Encounter: Payer: Self-pay | Admitting: Family Medicine

## 2021-09-11 ENCOUNTER — Other Ambulatory Visit: Payer: Self-pay

## 2021-09-11 ENCOUNTER — Ambulatory Visit (INDEPENDENT_AMBULATORY_CARE_PROVIDER_SITE_OTHER): Payer: Medicaid Other | Admitting: Family Medicine

## 2021-09-11 VITALS — BP 97/67 | HR 81 | Temp 98.3°F | Resp 16 | Wt 104.2 lb

## 2021-09-11 DIAGNOSIS — M25552 Pain in left hip: Secondary | ICD-10-CM | POA: Diagnosis not present

## 2021-09-11 MED ORDER — TRIAMCINOLONE ACETONIDE 40 MG/ML IJ SUSP
40.0000 mg | Freq: Once | INTRAMUSCULAR | Status: AC
  Administered 2021-09-11: 40 mg via INTRAMUSCULAR

## 2021-09-11 NOTE — Progress Notes (Signed)
Patient is here for f/up medication change. Patient stop taking the Gabapentin due to the side effects made her feel worst.

## 2021-09-12 NOTE — Progress Notes (Signed)
Established Patient Office Visit  Subjective:  Patient ID: Brooke Bush, female    DOB: 06-03-1991  Age: 30 y.o. MRN: [id number]  CC:  Chief Complaint  Patient presents with   Follow-up    Medication change    HPI Brooke Bush Bold presents for continued complaint of left hip/groin pain. Patient stopped taking the neurontin as she did not like the SE, primarily drowsiness. She did not feel that it helped her sx.   Past Medical History:  Diagnosis Date   Anemia    External hemorrhoids    GERD (gastroesophageal reflux disease)    Vaginal Pap smear, abnormal       Social History   Socioeconomic History   Marital status: Significant Other    Spouse name: Not on file   Number of children: Not on file   Years of education: Not on file   Highest education level: Not on file  Occupational History   Not on file  Tobacco Use   Smoking status: Never   Smokeless tobacco: Never  Vaping Use   Vaping Use: Never used  Substance and Sexual Activity   Alcohol use: No   Drug use: No   Sexual activity: Yes    Birth control/protection: Condom  Other Topics Concern   Not on file  Social History Narrative   Not on file   Social Determinants of Health   Financial Resource Strain: Not on file  Food Insecurity: No Food Insecurity   Worried About Running Out of Food in the Last Year: Never true   Ran Out of Food in the Last Year: Never true  Transportation Needs: No Transportation Needs   Lack of Transportation (Medical): No   Lack of Transportation (Non-Medical): No  Physical Activity: Not on file  Stress: Not on file  Social Connections: Not on file  Intimate Partner Violence: Not on file    ROS Review of Systems  All other systems reviewed and are negative.  Objective:   Today's Vitals: BP 97/67 (BP Location: Right Arm, Patient Position: Sitting, Cuff Size: Normal)   Pulse 81   Temp 98.3 F (36.8 C) (Oral)   Resp 16   Wt 104 lb 3.2 oz (47.3 kg)   SpO2 99%   BMI 19.05  kg/m   Physical Exam Vitals and nursing note reviewed.  Constitutional:      General: She is not in acute distress. Cardiovascular:     Rate and Rhythm: Normal rate and regular rhythm.  Pulmonary:     Effort: Pulmonary effort is normal.     Breath sounds: Normal breath sounds.  Musculoskeletal:     Right hip: Normal.     Left hip: Tenderness present. No deformity. Normal range of motion.  Neurological:     General: No focal deficit present.     Mental Status: She is alert and oriented to person, place, and time.    Assessment & Plan:   1. Left hip pain Kenalog IM injection given. Tylenol/OTC preps prn. Exercises given - patient defers PT referral at this time - triamcinolone acetonide (KENALOG-40) injection 40 mg   Outpatient Encounter Medications as of 09/11/2021  Medication Sig   triamcinolone cream (KENALOG) 0.1 % Apply 1 application topically 2 (two) times daily.   gabapentin (NEURONTIN) 300 MG capsule Take 1 capsule (300 mg total) by mouth at bedtime. (Patient not taking: Reported on 09/11/2021)   [EXPIRED] triamcinolone acetonide (KENALOG-40) injection 40 mg    No facility-administered encounter medications on  file as of 09/11/2021.    Follow-up: Return in about 6 weeks (around 10/23/2021).   Tommie Raymond, MD

## 2021-10-23 ENCOUNTER — Ambulatory Visit: Payer: Medicaid Other | Admitting: Family Medicine

## 2021-10-29 ENCOUNTER — Other Ambulatory Visit: Payer: Self-pay

## 2021-10-29 ENCOUNTER — Encounter: Payer: Self-pay | Admitting: Family Medicine

## 2021-10-29 ENCOUNTER — Ambulatory Visit (INDEPENDENT_AMBULATORY_CARE_PROVIDER_SITE_OTHER): Payer: Medicaid Other | Admitting: Family Medicine

## 2021-10-29 VITALS — BP 98/65 | HR 91 | Temp 98.1°F | Resp 16 | Wt 102.0 lb

## 2021-10-29 DIAGNOSIS — M25552 Pain in left hip: Secondary | ICD-10-CM

## 2021-10-29 NOTE — Progress Notes (Signed)
   Established Patient Office Visit  Subjective:  Patient ID: Brooke Bush, female    DOB: Mar 14, 1991  Age: 30 y.o. MRN: [id number]  CC:  Chief Complaint  Patient presents with  . Follow-up  . Back Pain    HPI Brooke Bush presents for follow up of back/hip pain. She reports that symptoms are much improved.   Past Medical History:  Diagnosis Date  . Anemia   . External hemorrhoids   . GERD (gastroesophageal reflux disease)   . Vaginal Pap smear, abnormal      No family history on file.  Social History   Socioeconomic History  . Marital status: Significant Other    Spouse name: Not on file  . Number of children: Not on file  . Years of education: Not on file  . Highest education level: Not on file  Occupational History  . Not on file  Tobacco Use  . Smoking status: Never  . Smokeless tobacco: Never  Vaping Use  . Vaping Use: Never used  Substance and Sexual Activity  . Alcohol use: No  . Drug use: No  . Sexual activity: Yes    Birth control/protection: Condom  Other Topics Concern  . Not on file  Social History Narrative  . Not on file   Social Determinants of Health   Financial Resource Strain: Not on file  Food Insecurity: No Food Insecurity  . Worried About Programme researcher, broadcasting/film/video in the Last Year: Never true  . Ran Out of Food in the Last Year: Never true  Transportation Needs: No Transportation Needs  . Lack of Transportation (Medical): No  . Lack of Transportation (Non-Medical): No  Physical Activity: Not on file  Stress: Not on file  Social Connections: Not on file  Intimate Partner Violence: Not on file    ROS Review of Systems  All other systems reviewed and are negative.  Objective:   Today's Vitals: BP 98/65 (BP Location: Right Arm, Patient Position: Sitting, Cuff Size: Normal)   Pulse 91   Temp 98.1 F (36.7 C) (Oral)   Resp 16   Wt 102 lb (46.3 kg)   SpO2 98%   BMI 18.65 kg/m   Physical Exam Vitals and nursing note reviewed.   Constitutional:      General: She is not in acute distress. Cardiovascular:     Rate and Rhythm: Normal rate and regular rhythm.  Pulmonary:     Effort: Pulmonary effort is normal.     Breath sounds: Normal breath sounds.  Musculoskeletal:     Right hip: Normal.  Neurological:     General: No focal deficit present.     Mental Status: She is alert and oriented to person, place, and time.    Assessment & Plan:   1. Left hip pain Much improved. Continue with tylenol/nsaids/topical preps prn. Follow up if sx persist or worsen   Outpatient Encounter Medications as of 10/29/2021  Medication Sig  . gabapentin (NEURONTIN) 300 MG capsule Take 1 capsule (300 mg total) by mouth at bedtime. (Patient not taking: No sig reported)  . triamcinolone cream (KENALOG) 0.1 % Apply 1 application topically 2 (two) times daily. (Patient not taking: Reported on 10/29/2021)   No facility-administered encounter medications on file as of 10/29/2021.    Follow-up: Return if symptoms worsen or fail to improve.   Tommie Raymond, MD

## 2021-10-29 NOTE — Progress Notes (Signed)
Patient said she is doing better with back pain

## 2021-12-06 ENCOUNTER — Encounter: Payer: Self-pay | Admitting: Family Medicine

## 2021-12-06 ENCOUNTER — Ambulatory Visit: Payer: Medicaid Other | Admitting: Family Medicine

## 2021-12-06 ENCOUNTER — Other Ambulatory Visit: Payer: Self-pay

## 2021-12-06 VITALS — BP 106/66 | HR 93 | Temp 98.8°F | Resp 18 | Ht 62.01 in | Wt 106.0 lb

## 2021-12-06 DIAGNOSIS — R1013 Epigastric pain: Secondary | ICD-10-CM | POA: Diagnosis not present

## 2021-12-06 MED ORDER — DICYCLOMINE HCL 20 MG PO TABS: 20.0000 mg | ORAL_TABLET | Freq: Three times a day (TID) | ORAL | 0 refills | Status: DC

## 2021-12-06 NOTE — Progress Notes (Signed)
Pt complains of gastric issues, symptoms include bloating, gas, abdominal pain

## 2021-12-07 ENCOUNTER — Encounter: Payer: Self-pay | Admitting: Family Medicine

## 2021-12-07 NOTE — Progress Notes (Signed)
Established Patient Office Visit  Subjective:  Patient ID: Brooke Bush, female    DOB: 28-Jan-1991  Age: 30 y.o. MRN: [id number]  CC:  Chief Complaint  Patient presents with   GI Problem    HPI Brooke Bush presents for complaint of intermittent abdominal cramping and bloating and gas. Denies fever/chills. Denies known dietary precipitants.   Past Medical History:  Diagnosis Date   Anemia    External hemorrhoids    GERD (gastroesophageal reflux disease)    Vaginal Pap smear, abnormal     Past Surgical History:  Procedure Laterality Date   NO PAST SURGERIES     WISDOM TOOTH EXTRACTION      No family history on file.  Social History   Socioeconomic History   Marital status: Significant Other    Spouse name: Not on file   Number of children: Not on file   Years of education: Not on file   Highest education level: Not on file  Occupational History   Not on file  Tobacco Use   Smoking status: Never   Smokeless tobacco: Never  Vaping Use   Vaping Use: Never used  Substance and Sexual Activity   Alcohol use: No   Drug use: No   Sexual activity: Yes    Birth control/protection: Condom  Other Topics Concern   Not on file  Social History Narrative   Not on file   Social Determinants of Health   Financial Resource Strain: Not on file  Food Insecurity: Not on file  Transportation Needs: Not on file  Physical Activity: Not on file  Stress: Not on file  Social Connections: Not on file  Intimate Partner Violence: Not on file    ROS Review of Systems  Gastrointestinal:  Positive for abdominal distention and abdominal pain. Negative for diarrhea, nausea and vomiting.  All other systems reviewed and are negative.  Objective:   Today's Vitals: BP 106/66 (BP Location: Right Arm, Patient Position: Sitting, Cuff Size: Normal)    Pulse 93    Temp 98.8 F (37.1 C)    Resp 18    Ht 5' 2.01" (1.575 m)    Wt 106 lb (48.1 kg)    SpO2 96%    BMI 19.38 kg/m   Physical  Exam Vitals and nursing note reviewed.  Constitutional:      General: She is not in acute distress. Cardiovascular:     Rate and Rhythm: Normal rate and regular rhythm.  Pulmonary:     Effort: Pulmonary effort is normal.     Breath sounds: Normal breath sounds.  Abdominal:     Palpations: Abdomen is soft.     Tenderness: There is no abdominal tenderness.  Neurological:     General: No focal deficit present.     Mental Status: She is alert and oriented to person, place, and time.    Assessment & Plan:   1. Dyspepsia Discussed keeping dietary diary as could be lactose or gluten precipitant. Bentyl prescribed. Monitor.  - dicyclomine (BENTYL) 20 MG tablet; Take 1 tablet (20 mg total) by mouth 4 (four) times daily -  before meals and at bedtime.  Dispense: 120 tablet; Refill: 0    Outpatient Encounter Medications as of 12/06/2021  Medication Sig   dicyclomine (BENTYL) 20 MG tablet Take 1 tablet (20 mg total) by mouth 4 (four) times daily -  before meals and at bedtime.   gabapentin (NEURONTIN) 300 MG capsule Take 1 capsule (300 mg total) by  mouth at bedtime. (Patient not taking: No sig reported)   triamcinolone cream (KENALOG) 0.1 % Apply 1 application topically 2 (two) times daily. (Patient not taking: Reported on 10/29/2021)   No facility-administered encounter medications on file as of 12/06/2021.    Follow-up: No follow-ups on file.   Tommie Raymond, MD

## 2022-01-14 ENCOUNTER — Ambulatory Visit: Payer: Medicaid Other | Admitting: Family Medicine

## 2022-01-14 ENCOUNTER — Encounter: Payer: Self-pay | Admitting: Family Medicine

## 2022-01-14 ENCOUNTER — Other Ambulatory Visit: Payer: Self-pay

## 2022-01-14 VITALS — BP 104/71 | HR 78 | Temp 98.0°F | Resp 16 | Ht 62.0 in | Wt 110.0 lb

## 2022-01-14 DIAGNOSIS — F411 Generalized anxiety disorder: Secondary | ICD-10-CM | POA: Diagnosis not present

## 2022-01-14 DIAGNOSIS — R21 Rash and other nonspecific skin eruption: Secondary | ICD-10-CM

## 2022-01-14 NOTE — Progress Notes (Signed)
Established Patient Office Visit  Subjective:  Patient ID: Brooke Bush, female    DOB: September 21, 1991  Age: 31 y.o. MRN: [id number]  CC:  Chief Complaint  Patient presents with   Follow-up   Rash    HPI Brooke Bush presents for complaint of intermittent sores and lesions on her legs. She reports that the cream that she had been prescribed previously is helpful but that the lesions keep occurring and that she also has some dark spots where the lesions have resolved. Denies known trauma or injury. Denies known contacts or exposures.   Past Medical History:  Diagnosis Date   Anemia    External hemorrhoids    GERD (gastroesophageal reflux disease)    Vaginal Pap smear, abnormal     Past Surgical History:  Procedure Laterality Date   NO PAST SURGERIES     WISDOM TOOTH EXTRACTION      No family history on file.  Social History   Socioeconomic History   Marital status: Significant Other    Spouse name: Not on file   Number of children: Not on file   Years of education: Not on file   Highest education level: Not on file  Occupational History   Not on file  Tobacco Use   Smoking status: Never   Smokeless tobacco: Never  Vaping Use   Vaping Use: Never used  Substance and Sexual Activity   Alcohol use: No   Drug use: No   Sexual activity: Yes    Birth control/protection: Condom  Other Topics Concern   Not on file  Social History Narrative   Not on file   Social Determinants of Health   Financial Resource Strain: Not on file  Food Insecurity: Not on file  Transportation Needs: Not on file  Physical Activity: Not on file  Stress: Not on file  Social Connections: Not on file  Intimate Partner Violence: Not on file    ROS Review of Systems  Skin:  Positive for rash.  All other systems reviewed and are negative.  Objective:   Today's Vitals: BP 104/71    Pulse 78    Temp 98 F (36.7 C) (Oral)    Resp 16    Ht 5\' 2"  (1.575 m)    Wt 110 lb (49.9 kg)    SpO2 99%     BMI 20.12 kg/m   Physical Exam Vitals and nursing note reviewed.  Constitutional:      General: She is not in acute distress. Cardiovascular:     Rate and Rhythm: Normal rate and regular rhythm.  Pulmonary:     Effort: Pulmonary effort is normal.     Breath sounds: Normal breath sounds.  Neurological:     General: No focal deficit present.     Mental Status: She is alert and oriented to person, place, and time.  Psychiatric:        Mood and Affect: Mood is anxious.        Behavior: Behavior is cooperative.    Assessment & Plan:   1. Rash and nonspecific skin eruption Continue to utilize cream. Referred to derm for further eval and management, especially 2/2 to patients anxiety.  - Ambulatory referral to Dermatology  2. Anxiety state Most likely related to above. Will monitor    Outpatient Encounter Medications as of 01/14/2022  Medication Sig   dicyclomine (BENTYL) 20 MG tablet Take 1 tablet (20 mg total) by mouth 4 (four) times daily -  before meals  and at bedtime.   gabapentin (NEURONTIN) 300 MG capsule Take 1 capsule (300 mg total) by mouth at bedtime.   triamcinolone cream (KENALOG) 0.1 % Apply 1 application topically 2 (two) times daily.   No facility-administered encounter medications on file as of 01/14/2022.    Follow-up: No follow-ups on file.   Becky Sax, MD

## 2022-01-14 NOTE — Progress Notes (Signed)
Patient has a rash on her legs and is very concern as to what it may be. Patient said rash is very sore. Patient has not use anything different as far as soaps, washing powder.

## 2022-03-11 ENCOUNTER — Encounter: Payer: Self-pay | Admitting: Family Medicine

## 2022-03-11 ENCOUNTER — Ambulatory Visit: Payer: Medicaid Other | Admitting: Family Medicine

## 2022-03-11 ENCOUNTER — Other Ambulatory Visit: Payer: Self-pay

## 2022-03-11 VITALS — BP 121/82 | Temp 97.3°F | Resp 16 | Wt 107.4 lb

## 2022-03-11 DIAGNOSIS — F32A Depression, unspecified: Secondary | ICD-10-CM | POA: Diagnosis not present

## 2022-03-11 DIAGNOSIS — F419 Anxiety disorder, unspecified: Secondary | ICD-10-CM | POA: Diagnosis not present

## 2022-03-11 DIAGNOSIS — R350 Frequency of micturition: Secondary | ICD-10-CM | POA: Diagnosis not present

## 2022-03-11 DIAGNOSIS — R319 Hematuria, unspecified: Secondary | ICD-10-CM | POA: Diagnosis not present

## 2022-03-11 LAB — POCT URINALYSIS DIP (CLINITEK)
Bilirubin, UA: NEGATIVE
Glucose, UA: NEGATIVE mg/dL
Ketones, POC UA: NEGATIVE mg/dL
Leukocytes, UA: NEGATIVE
Nitrite, UA: NEGATIVE
POC PROTEIN,UA: NEGATIVE
Spec Grav, UA: 1.015 (ref 1.010–1.025)
Urobilinogen, UA: 0.2 E.U./dL
pH, UA: 6.5 (ref 5.0–8.0)

## 2022-03-11 MED ORDER — SULFAMETHOXAZOLE-TRIMETHOPRIM 800-160 MG PO TABS: 1.0000 | ORAL_TABLET | Freq: Two times a day (BID) | ORAL | 0 refills | Status: DC

## 2022-03-11 NOTE — Progress Notes (Signed)
? ?Established Patient Office Visit ? ?Subjective:  ?Patient ID: Brooke Bush, female    DOB: [birth year]  Age: 31 y.o. MRN: [id number] ? ?CC:  ?Chief Complaint  ?Patient presents with  ? Follow-up  ? Hip Pain  ? ? ?HPI ?Brooke Bush presents for complaint of left flank pain with general malaise. She also reports urinary frequency. She denies fever/chills or viral sx.  ? ?Past Medical History:  ?Diagnosis Date  ? Anemia   ? External hemorrhoids   ? GERD (gastroesophageal reflux disease)   ? Vaginal Pap smear, abnormal   ? ? ?Past Surgical History:  ?Procedure Laterality Date  ? NO PAST SURGERIES    ? WISDOM TOOTH EXTRACTION    ? ? ?No family history on file. ? ?Social History  ? ?Socioeconomic History  ? Marital status: Significant Other  ?  Spouse name: Not on file  ? Number of children: Not on file  ? Years of education: Not on file  ? Highest education level: Not on file  ?Occupational History  ? Not on file  ?Tobacco Use  ? Smoking status: Never  ? Smokeless tobacco: Never  ?Vaping Use  ? Vaping Use: Never used  ?Substance and Sexual Activity  ? Alcohol use: No  ? Drug use: No  ? Sexual activity: Yes  ?  Birth control/protection: Condom  ?Other Topics Concern  ? Not on file  ?Social History Narrative  ? Not on file  ? ?Social Determinants of Health  ? ?Financial Resource Strain: Not on file  ?Food Insecurity: Not on file  ?Transportation Needs: Not on file  ?Physical Activity: Not on file  ?Stress: Not on file  ?Social Connections: Not on file  ?Intimate Partner Violence: Not on file  ? ? ?ROS ?Review of Systems  ?Constitutional:  Negative for chills and fever.  ?Genitourinary:  Positive for dysuria, flank pain and frequency.  ?Psychiatric/Behavioral:  Negative for self-injury, sleep disturbance and suicidal ideas. The patient is nervous/anxious.   ?All other systems reviewed and are negative. ? ?Objective:  ? ?Today's Vitals: BP 121/82   Temp (!) 97.3 ?F (36.3 ?C) (Oral)   Resp 16   Wt 107 lb 6.4 oz (48.7 kg)    SpO2 97%   BMI 19.64 kg/m?  ? ?Physical Exam ?Vitals and nursing note reviewed.  ?Constitutional:   ?   General: She is not in acute distress. ?Cardiovascular:  ?   Rate and Rhythm: Normal rate and regular rhythm.  ?Pulmonary:  ?   Effort: Pulmonary effort is normal.  ?   Breath sounds: Normal breath sounds.  ?Abdominal:  ?   Palpations: Abdomen is soft.  ?   Tenderness: There is no abdominal tenderness. There is left CVA tenderness. There is no right CVA tenderness.  ?Neurological:  ?   General: No focal deficit present.  ?   Mental Status: She is alert and oriented to person, place, and time.  ?Psychiatric:     ?   Mood and Affect: Affect normal. Mood is anxious.     ?   Speech: Speech normal.  ? ? ?Assessment & Plan:  ? ?1. Hematuria, unspecified type ?Bactrim DS prescribed. Adequate fluids recommended. ?- POCT URINALYSIS DIP (CLINITEK) ? ?2. Frequency of urination ?As above ? ?3. Anxiety and depression ?Patient defers considering any agent until follow up.  ? ? ? ?Outpatient Encounter Medications as of 03/11/2022  ?Medication Sig  ? dicyclomine (BENTYL) 20 MG tablet Take 1 tablet (20 mg total)  by mouth 4 (four) times daily -  before meals and at bedtime.  ? sulfamethoxazole-trimethoprim (BACTRIM DS) 800-160 MG tablet Take 1 tablet by mouth 2 (two) times daily.  ? gabapentin (NEURONTIN) 300 MG capsule Take 1 capsule (300 mg total) by mouth at bedtime. (Patient not taking: Reported on 03/11/2022)  ? triamcinolone cream (KENALOG) 0.1 % Apply 1 application topically 2 (two) times daily. (Patient not taking: Reported on 03/11/2022)  ? ?No facility-administered encounter medications on file as of 03/11/2022.  ? ? ?Follow-up: No follow-ups on file.  ? ?Tommie Raymond, MD ? ?

## 2022-04-15 ENCOUNTER — Ambulatory Visit: Payer: Medicaid Other | Admitting: Family Medicine

## 2022-05-23 ENCOUNTER — Ambulatory Visit: Payer: Medicaid Other | Admitting: Family Medicine

## 2022-06-17 ENCOUNTER — Ambulatory Visit: Payer: Medicaid Other | Admitting: Family Medicine

## 2022-07-01 ENCOUNTER — Ambulatory Visit: Payer: Medicaid Other | Admitting: Family Medicine

## 2022-07-29 ENCOUNTER — Ambulatory Visit: Payer: Self-pay

## 2022-07-29 NOTE — Telephone Encounter (Signed)
    Chief Complaint: Left lower abdominal pain, comes and goes. Symptoms: Pain, bloating, some constipation. Frequency: Several months, getting worse Pertinent Negatives: Patient denies  Disposition: [] ED /[] Urgent Care (no appt availability in office) / [x] Appointment(In office/virtual)/ []  [facility]/ [] Home Care/ [] Refused Recommended Disposition /[] [facility]/ []  Follow-up with PCP Additional Notes: Go to ED for worsening of symptoms.  Reason for Disposition  Abdominal pain is a chronic symptom (recurrent or ongoing AND present > 4 weeks)  Answer Assessment - Initial Assessment Questions 1. LOCATION: "Where does it hurt?"      Lower left side 2. RADIATION: "Does the pain shoot anywhere else?" (e.g., chest, back)     Back 3. ONSET: "When did the pain begin?" (e.g., minutes, hours or days ago)      2 days ago 4. SUDDEN: "Gradual or sudden onset?"     Gradual 5. PATTERN "Does the pain come and go, or is it constant?"    - If it comes and goes: "How long does it last?" "Do you have pain now?"     (Note: Comes and goes means the pain is intermittent. It goes away completely between bouts.)    - If constant: "Is it getting better, staying the same, or getting worse?"      (Note: Constant means the pain never goes away completely; most serious pain is constant and gets worse.)      Comes and goes 6. SEVERITY: "How bad is the pain?"  (e.g., Scale 1-10; mild, moderate, or severe)    - MILD (1-3): Doesn't interfere with normal activities, abdomen soft and not tender to touch.     - MODERATE (4-7): Interferes with normal activities or awakens from sleep, abdomen tender to touch.     - SEVERE (8-10): Excruciating pain, doubled over, unable to do any normal activities.       Now - mild 7. RECURRENT SYMPTOM: "Have you ever had this type of stomach pain before?" If Yes, ask: "When was the last time?" and "What happened that time?"      yES 8. CAUSE: "What do you think  is causing the stomach pain?"     Unsure 9. RELIEVING/AGGRAVATING FACTORS: "What makes it better or worse?" (e.g., antacids, bending or twisting motion, bowel movement)     No 10. OTHER SYMPTOMS: "Do you have any other symptoms?" (e.g., back pain, diarrhea, fever, urination pain, vomiting)       Bloating, constipation 11. PREGNANCY: "Is there any chance you are pregnant?" "When was your last menstrual period?"       No  Protocols used: Abdominal Pain - [facility]

## 2022-07-30 NOTE — Progress Notes (Unsigned)
Patient ID: Brooke Bush, female    DOB: [birth year]  MRN: [id number]  CC: Follow-Up  Subjective: Brooke Bush is a 31 y.o. female who presents for follow-up.  Her concerns today include:  - Abdominal pain persisting. Bentyl helps. Last appointment with PCP 12/06/2021. - Left hip pain persisting. Gabapentin did not help. Last appointment with PCP 10/29/2021.  Patient Active Problem List   Diagnosis Date Noted   Pelvic pain in female 10/31/2020   MENORRHALGIA 07/18/2008     Current Outpatient Medications on File Prior to Visit  Medication Sig Dispense Refill   clindamycin (CLEOCIN T) 1 % external solution Apply topically 2 (two) times daily.     No current facility-administered medications on file prior to visit.    Allergies  Allergen Reactions   Neutrogena Lip Moisturizer Rash   Sunscreens Rash    Social History   Socioeconomic History   Marital status: Significant Other    Spouse name: Not on file   Number of children: Not on file   Years of education: Not on file   Highest education level: Not on file  Occupational History   Not on file  Tobacco Use   Smoking status: Never    Passive exposure: Never   Smokeless tobacco: Never  Vaping Use   Vaping Use: Never used  Substance and Sexual Activity   Alcohol use: No   Drug use: No   Sexual activity: Yes    Birth control/protection: Condom  Other Topics Concern   Not on file  Social History Narrative   Not on file   Social Determinants of Health   Financial Resource Strain: Not on file  Food Insecurity: No Food Insecurity (10/31/2020)   Hunger Vital Sign    Worried About Running Out of Food in the Last Year: Never true    Ran Out of Food in the Last Year: Never true  Transportation Needs: No Transportation Needs (10/31/2020)   PRAPARE - Hydrologist (Medical): No    Lack of Transportation (Non-Medical): No  Physical Activity: Not on file  Stress: Not on file  Social  Connections: Not on file  Intimate Partner Violence: Not on file    No family history on file.  Past Surgical History:  Procedure Laterality Date   NO PAST SURGERIES     WISDOM TOOTH EXTRACTION      ROS: Review of Systems Negative except as stated above  PHYSICAL EXAM: BP 102/69 (BP Location: Left Arm, Patient Position: Sitting, Cuff Size: Normal)   Pulse 74   Temp 98.3 F (36.8 C)   Resp 16   Ht 5' 2.01" (1.575 m)   SpO2 98%   BMI 19.64 kg/m   Physical Exam HENT:     Head: Normocephalic and atraumatic.  Eyes:     Extraocular Movements: Extraocular movements intact.     Conjunctiva/sclera: Conjunctivae normal.     Pupils: Pupils are equal, round, and reactive to light.  Cardiovascular:     Rate and Rhythm: Normal rate and regular rhythm.     Pulses: Normal pulses.     Heart sounds: Normal heart sounds.  Pulmonary:     Effort: Pulmonary effort is normal.     Breath sounds: Normal breath sounds.  Musculoskeletal:     Cervical back: Normal range of motion and neck supple.  Neurological:     General: No focal deficit present.     Mental Status: She is alert and oriented  to person, place, and time.  Psychiatric:        Mood and Affect: Mood normal.        Behavior: Behavior normal.    Results for orders placed or performed in visit on 08/01/22  POCT glycosylated hemoglobin (Hb A1C)  Result Value Ref Range   Hemoglobin A1C 5.1 4.0 - 5.6 %   HbA1c POC (<> result, manual entry)     HbA1c, POC (prediabetic range)     HbA1c, POC (controlled diabetic range)    POCT urine pregnancy  Result Value Ref Range   Preg Test, Ur Negative Negative  POCT URINALYSIS DIP (CLINITEK)  Result Value Ref Range   Color, UA yellow yellow   Clarity, UA clear clear   Glucose, UA negative negative mg/dL   Bilirubin, UA negative negative   Ketones, POC UA negative negative mg/dL   Spec Grav, UA 1.020 1.010 - 1.025   Blood, UA trace-intact (A) negative   pH, UA 7.0 5.0 - 8.0   POC  PROTEIN,UA negative negative, trace   Urobilinogen, UA 0.2 0.2 or 1.0 E.U./dL   Nitrite, UA Negative Negative   Leukocytes, UA Negative Negative     ASSESSMENT AND PLAN: 1. Dyspepsia - Continue Dicyclomine as prescribed.  - Omeprazole as prescribed. Counseled on medication adherence and adverse effects.  - Screening labs. - Follow-up with primary provider in 4 weeks or sooner if needed.  - CMP14+EGFR - Amylase - Lipase - Cervicovaginal ancillary only - POCT glycosylated hemoglobin (Hb A1C) - POCT urine pregnancy - CBC - omeprazole (PRILOSEC) 20 MG capsule; Take 1 capsule (20 mg total) by mouth daily.  Dispense: 30 capsule; Refill: 3 - POCT URINALYSIS DIP (CLINITEK) - dicyclomine (BENTYL) 20 MG tablet; Take 1 tablet (20 mg total) by mouth 4 (four) times daily -  before meals and at bedtime.  Dispense: 120 tablet; Refill: 0  2. Chronic left hip pain - Duloxetine as prescribed. Counseled on medication adherence and adverse effects.  - Follow-up with primary provider in 4 weeks or sooner if needed.  - DULoxetine (CYMBALTA) 20 MG capsule; Take 1 capsule (20 mg total) by mouth daily.  Dispense: 30 capsule; Refill: 0    Patient was given the opportunity to ask questions.  Patient verbalized understanding of the plan and was able to repeat key elements of the plan. Patient was given clear instructions to go to Emergency Department or return to medical center if symptoms don't improve, worsen, or new problems develop.The patient verbalized understanding.   Orders Placed This Encounter  Procedures   CMP14+EGFR   Amylase   Lipase   CBC   POCT glycosylated hemoglobin (Hb A1C)   POCT urine pregnancy   POCT URINALYSIS DIP (CLINITEK)     Requested Prescriptions   Signed Prescriptions Disp Refills   omeprazole (PRILOSEC) 20 MG capsule 30 capsule 3    Sig: Take 1 capsule (20 mg total) by mouth daily.   DULoxetine (CYMBALTA) 20 MG capsule 30 capsule 0    Sig: Take 1 capsule (20 mg  total) by mouth daily.   dicyclomine (BENTYL) 20 MG tablet 120 tablet 0    Sig: Take 1 tablet (20 mg total) by mouth 4 (four) times daily -  before meals and at bedtime.    Return in about 4 weeks (around 08/29/2022) for Follow-Up or next available abdominal pain with Amelia Wilson, MD..  Amy J Stephens, NP  

## 2022-08-01 ENCOUNTER — Other Ambulatory Visit (HOSPITAL_COMMUNITY)
Admission: RE | Admit: 2022-08-01 | Discharge: 2022-08-01 | Disposition: A | Discharge status: Home / Self Care | Payer: Medicaid Other | Source: Ambulatory Visit | Attending: Family | Admitting: Family

## 2022-08-01 ENCOUNTER — Encounter: Payer: Self-pay | Admitting: Family

## 2022-08-01 ENCOUNTER — Ambulatory Visit: Payer: Medicaid Other | Admitting: Family

## 2022-08-01 VITALS — BP 102/69 | HR 74 | Temp 98.3°F | Resp 16 | Ht 62.01 in

## 2022-08-01 DIAGNOSIS — R1013 Epigastric pain: Secondary | ICD-10-CM | POA: Diagnosis not present

## 2022-08-01 DIAGNOSIS — Z3202 Encounter for pregnancy test, result negative: Secondary | ICD-10-CM

## 2022-08-01 DIAGNOSIS — R109 Unspecified abdominal pain: Secondary | ICD-10-CM

## 2022-08-01 DIAGNOSIS — G8929 Other chronic pain: Secondary | ICD-10-CM | POA: Diagnosis not present

## 2022-08-01 DIAGNOSIS — M25552 Pain in left hip: Secondary | ICD-10-CM

## 2022-08-01 LAB — POCT URINALYSIS DIP (CLINITEK)
Bilirubin, UA: NEGATIVE
Glucose, UA: NEGATIVE mg/dL
Ketones, POC UA: NEGATIVE mg/dL
Leukocytes, UA: NEGATIVE
Nitrite, UA: NEGATIVE
POC PROTEIN,UA: NEGATIVE
Spec Grav, UA: 1.02 (ref 1.010–1.025)
Urobilinogen, UA: 0.2 E.U./dL
pH, UA: 7 (ref 5.0–8.0)

## 2022-08-01 LAB — POCT GLYCOSYLATED HEMOGLOBIN (HGB A1C): Hemoglobin A1C: 5.1 % (ref 4.0–5.6)

## 2022-08-01 LAB — POCT URINE PREGNANCY: Preg Test, Ur: NEGATIVE

## 2022-08-01 MED ORDER — OMEPRAZOLE 20 MG PO CPDR: 20.0000 mg | DELAYED_RELEASE_CAPSULE | Freq: Every day | ORAL | 3 refills | Status: DC

## 2022-08-01 MED ORDER — DICYCLOMINE HCL 20 MG PO TABS: 20.0000 mg | ORAL_TABLET | Freq: Three times a day (TID) | ORAL | 0 refills | Status: DC

## 2022-08-01 MED ORDER — DULOXETINE HCL 20 MG PO CPEP: 20.0000 mg | ORAL_CAPSULE | Freq: Every day | ORAL | 0 refills | Status: DC

## 2022-08-01 NOTE — Patient Instructions (Signed)

## 2022-08-01 NOTE — Progress Notes (Signed)
Pt presents for abdominal pain states that its more on left side

## 2022-08-02 ENCOUNTER — Other Ambulatory Visit: Payer: Self-pay | Admitting: Family

## 2022-08-02 DIAGNOSIS — B3731 Acute candidiasis of vulva and vagina: Secondary | ICD-10-CM | POA: Insufficient documentation

## 2022-08-02 LAB — CMP14+EGFR
ALT: 11 IU/L (ref 0–32)
AST: 17 IU/L (ref 0–40)
Albumin/Globulin Ratio: 1.3 (ref 1.2–2.2)
Albumin: 4.3 g/dL (ref 4.0–5.0)
Alkaline Phosphatase: 50 IU/L (ref 44–121)
BUN/Creatinine Ratio: 13 (ref 9–23)
BUN: 7 mg/dL (ref 6–20)
Bilirubin Total: 0.3 mg/dL (ref 0.0–1.2)
CO2: 21 mmol/L (ref 20–29)
Calcium: 9.7 mg/dL (ref 8.7–10.2)
Chloride: 102 mmol/L (ref 96–106)
Creatinine, Ser: 0.55 mg/dL — ABNORMAL LOW (ref 0.57–1.00)
Globulin, Total: 3.3 g/dL (ref 1.5–4.5)
Glucose: 59 mg/dL — ABNORMAL LOW (ref 70–99)
Potassium: 4.7 mmol/L (ref 3.5–5.2)
Sodium: 139 mmol/L (ref 134–144)
Total Protein: 7.6 g/dL (ref 6.0–8.5)
eGFR: 126 mL/min/{1.73_m2} (ref >59)

## 2022-08-02 LAB — CERVICOVAGINAL ANCILLARY ONLY
Bacterial Vaginitis (gardnerella): NEGATIVE
Candida Glabrata: NEGATIVE
Candida Vaginitis: POSITIVE — AB
Chlamydia: NEGATIVE
Comment: NEGATIVE
Comment: NEGATIVE
Comment: NEGATIVE
Comment: NEGATIVE
Comment: NEGATIVE
Comment: NORMAL
Neisseria Gonorrhea: NEGATIVE
Trichomonas: NEGATIVE

## 2022-08-02 LAB — CBC
Hematocrit: 37.8 % (ref 34.0–46.6)
Hemoglobin: 12.5 g/dL (ref 11.1–15.9)
MCH: 24.7 pg — ABNORMAL LOW (ref 26.6–33.0)
MCHC: 33.1 g/dL (ref 31.5–35.7)
MCV: 75 fL — ABNORMAL LOW (ref 79–97)
Platelets: 308 10*3/uL (ref 150–450)
RBC: 5.06 x10E6/uL (ref 3.77–5.28)
RDW: 13.6 % (ref 11.7–15.4)
WBC: 6.5 10*3/uL (ref 3.4–10.8)

## 2022-08-02 LAB — AMYLASE: Amylase: 118 U/L — ABNORMAL HIGH (ref 31–110)

## 2022-08-02 LAB — LIPASE: Lipase: 46 U/L (ref 14–72)

## 2022-08-02 MED ORDER — FLUCONAZOLE 150 MG PO TABS: 150.0000 mg | ORAL_TABLET | Freq: Once | ORAL | 0 refills | Status: AC

## 2022-09-04 ENCOUNTER — Encounter: Payer: Self-pay | Admitting: Family Medicine

## 2022-09-04 ENCOUNTER — Ambulatory Visit: Payer: Medicaid Other | Admitting: Family Medicine

## 2022-09-04 VITALS — BP 103/70 | HR 79 | Temp 97.7°F | Resp 16 | Wt 106.8 lb

## 2022-09-04 DIAGNOSIS — R300 Dysuria: Secondary | ICD-10-CM

## 2022-09-04 DIAGNOSIS — R1013 Epigastric pain: Secondary | ICD-10-CM | POA: Diagnosis not present

## 2022-09-04 DIAGNOSIS — N760 Acute vaginitis: Secondary | ICD-10-CM

## 2022-09-04 DIAGNOSIS — M25552 Pain in left hip: Secondary | ICD-10-CM

## 2022-09-04 DIAGNOSIS — G8929 Other chronic pain: Secondary | ICD-10-CM

## 2022-09-05 ENCOUNTER — Other Ambulatory Visit (HOSPITAL_COMMUNITY)
Admission: RE | Admit: 2022-09-05 | Discharge: 2022-09-05 | Disposition: A | Discharge status: Home / Self Care | Payer: Medicaid Other | Source: Ambulatory Visit | Attending: Family Medicine | Admitting: Family Medicine

## 2022-09-05 ENCOUNTER — Encounter: Payer: Self-pay | Admitting: Family Medicine

## 2022-09-05 DIAGNOSIS — N760 Acute vaginitis: Secondary | ICD-10-CM | POA: Diagnosis present

## 2022-09-05 NOTE — Progress Notes (Signed)
Established Patient Office Visit  Subjective    Patient ID: Brooke Bush, female    DOB: [birth year]  Age: 31 y.o. MRN: [id number]  CC:  Chief Complaint  Patient presents with   Follow-up    HPI Dieu H Grounds presents with compliant of urinary sx with some vag discharge. Patient also reports that she continues hip pain.    Outpatient Encounter Medications as of 09/04/2022  Medication Sig   DULoxetine (CYMBALTA) 20 MG capsule Take 1 capsule (20 mg total) by mouth daily.   [DISCONTINUED] clindamycin (CLEOCIN T) 1 % external solution Apply topically 2 (two) times daily.   [DISCONTINUED] dicyclomine (BENTYL) 20 MG tablet Take 1 tablet (20 mg total) by mouth 4 (four) times daily -  before meals and at bedtime.   [DISCONTINUED] omeprazole (PRILOSEC) 20 MG capsule Take 1 capsule (20 mg total) by mouth daily.   No facility-administered encounter medications on file as of 09/04/2022.    Past Medical History:  Diagnosis Date   Anemia    External hemorrhoids    GERD (gastroesophageal reflux disease)    Vaginal Pap smear, abnormal     Past Surgical History:  Procedure Laterality Date   NO PAST SURGERIES     WISDOM TOOTH EXTRACTION      No family history on file.  Social History   Socioeconomic History   Marital status: Significant Other    Spouse name: Not on file   Number of children: Not on file   Years of education: Not on file   Highest education level: Not on file  Occupational History   Not on file  Tobacco Use   Smoking status: Never    Passive exposure: Never   Smokeless tobacco: Never  Vaping Use   Vaping Use: Never used  Substance and Sexual Activity   Alcohol use: No   Drug use: No   Sexual activity: Yes    Birth control/protection: Condom  Other Topics Concern   Not on file  Social History Narrative   Not on file   Social Determinants of Health   Financial Resource Strain: Not on file  Food Insecurity: No Food Insecurity (10/31/2020)   Hunger Vital  Sign    Worried About Running Out of Food in the Last Year: Never true    Ran Out of Food in the Last Year: Never true  Transportation Needs: No Transportation Needs (10/31/2020)   PRAPARE - Administrator, Civil Service (Medical): No    Lack of Transportation (Non-Medical): No  Physical Activity: Not on file  Stress: Not on file  Social Connections: Not on file  Intimate Partner Violence: Not on file    Review of Systems  All other systems reviewed and are negative.       Objective    BP 103/70   Pulse 79   Temp 97.7 F (36.5 C) (Oral)   Resp 16   Wt 106 lb 12.8 oz (48.4 kg)   SpO2 93%   BMI 19.53 kg/m   Physical Exam Vitals and nursing note reviewed.  Constitutional:      General: She is not in acute distress. Cardiovascular:     Rate and Rhythm: Normal rate and regular rhythm.  Pulmonary:     Effort: Pulmonary effort is normal.     Breath sounds: Normal breath sounds.  Abdominal:     Palpations: Abdomen is soft.     Tenderness: There is no abdominal tenderness.  Musculoskeletal:  Left hip: Tenderness present. Normal range of motion.  Neurological:     General: No focal deficit present.     Mental Status: She is alert and oriented to person, place, and time.         Assessment & Plan:   1. Vaginitis and vulvovaginitis Swab results pending  2. Dysuria ? 2/2 above  3. Dyspepsia Improved. Continue present management  4. Chronic left hip pain Stable. Continue present management  No follow-ups on file.   Tommie Raymond, MD

## 2022-09-06 LAB — CERVICOVAGINAL ANCILLARY ONLY
Bacterial Vaginitis (gardnerella): NEGATIVE
Candida Glabrata: NEGATIVE
Candida Vaginitis: POSITIVE — AB
Chlamydia: NEGATIVE
Comment: NEGATIVE
Comment: NEGATIVE
Comment: NEGATIVE
Comment: NEGATIVE
Comment: NEGATIVE
Comment: NORMAL
Neisseria Gonorrhea: NEGATIVE
Trichomonas: NEGATIVE

## 2022-09-07 ENCOUNTER — Other Ambulatory Visit: Payer: Self-pay | Admitting: Family Medicine

## 2022-09-07 MED ORDER — FLUCONAZOLE 150 MG PO TABS: 150.0000 mg | ORAL_TABLET | Freq: Once | ORAL | 0 refills | Status: AC

## 2022-09-08 ENCOUNTER — Other Ambulatory Visit: Payer: Self-pay | Admitting: Family

## 2022-09-08 DIAGNOSIS — G8929 Other chronic pain: Secondary | ICD-10-CM

## 2022-09-09 NOTE — Telephone Encounter (Signed)
Requested Prescriptions  Pending Prescriptions Disp Refills  . DULoxetine (CYMBALTA) 20 MG capsule [Pharmacy Med Name: DULOXETINE DR 20MG CAPSULES] 30 capsule 0    Sig: TAKE 1 CAPSULE(20 MG) BY MOUTH DAILY     Psychiatry: Antidepressants - SNRI - duloxetine Failed - 09/08/2022  4:42 PM      Failed - Cr in normal range and within 360 days    Creatinine, Ser  Date Value Ref Range Status  08/01/2022 0.55 (L) 0.57 - 1.00 mg/dL Final         Passed - eGFR is 30 or above and within 360 days    GFR, Estimated  Date Value Ref Range Status  11/25/2020 >60 >60 mL/min Final    Comment:    (NOTE) Calculated using the CKD-EPI Creatinine Equation (2021)    eGFR  Date Value Ref Range Status  08/01/2022 126 >59 mL/min/1.73 Final         Passed - Completed PHQ-2 or PHQ-9 in the last 360 days      Passed - Last BP in normal range    BP Readings from Last 1 Encounters:  09/04/22 103/70         Passed - Valid encounter within last 6 months    Recent Outpatient Visits          5 days ago Vaginitis and vulvovaginitis   [facility] at [facility], MD   1 month ago Dyspepsia   Primary [facility] at [facility], Amy J, NP   6 months ago Hematuria, unspecified type   Primary Care at [facility], Clyde Canterbury, MD   7 months ago Rash and nonspecific skin eruption   Primary Care at [facility], MD   9 months ago Dyspepsia   Primary Care at [facility], MD

## 2022-10-01 ENCOUNTER — Encounter (HOSPITAL_COMMUNITY): Payer: Self-pay | Admitting: *Deleted

## 2022-10-01 ENCOUNTER — Ambulatory Visit (HOSPITAL_COMMUNITY)
Admission: EM | Admit: 2022-10-01 | Discharge: 2022-10-01 | Disposition: A | Discharge status: Home / Self Care | Payer: Medicaid Other | Attending: Emergency Medicine | Admitting: Emergency Medicine

## 2022-10-01 DIAGNOSIS — R102 Pelvic and perineal pain: Secondary | ICD-10-CM | POA: Diagnosis present

## 2022-10-01 MED ORDER — FLUCONAZOLE 150 MG PO TABS: 150.0000 mg | ORAL_TABLET | Freq: Every day | ORAL | 0 refills | Status: AC

## 2022-10-01 NOTE — Discharge Instructions (Signed)
It is possible that your reoccurring symptoms are due to a reoccurring yeast infection triggered by the ending of your menstruation, yeast can occur whenever there is a change in your vaginal pH that allows for the fungus to thrive and growth  We have completed a vaginal swab today to check for common infections, if you are positive you will be notified and medication sent in  Due to your reoccurring yeast infections we will prophylactically treat today, take 1 Diflucan tablet in 3 days take second dose  After your next period on the last day I would like you to purchase an over-the-counter 3-day Monistat vaginal suppository in use, ideally this medicine is going to reset your vaginal pH and treat any fungus that has already begun to grow and keep you from having symptoms  Keep your upcoming gynecology appointment as they can complete further evaluation work-up for conditions such as endometriosis  If you have any concerns prior to your gynecology appointment please follow-up with urgent care as needed

## 2022-10-01 NOTE — ED Triage Notes (Signed)
Pt states that she has pelvic pain x 2 years only on the right side she say she has seen multiple different doctors but she is hoping a new doctor will give her some answers. Her pcp put her on an antidepressants due to the patint. She has been to the ED. She said that the pain radiates down her left leg. She states no meds help the pain.   Since giving birth to her child she has noticed everything on the left side is different.

## 2022-10-01 NOTE — ED Provider Notes (Signed)
[facility]    CSN: [id number] Arrival date & time: 10/01/22  1547      History   Chief Complaint Chief Complaint  Patient presents with   Hip Pain    HPI IVELIZ GARAY is a 31 y.o. female.   Patient presents with constant pelvic pain for 2 years.  Endorses that pain is there at all times but fluctuates in intensity, pain typically worsens for approximately 1 week after the month following her menstruation.  Endorses abdominal cramping to the left pelvis that is described as severe and can be felt when pressure is applied to the back, worsened and felt with all movement.  Endorses around this time that she typically begins to have vaginal discharge and irritation accompanying symptoms.  Has been followed by primary care doctor who is started patient on antidepressants, deemed unhelpful but endorses she has only been taking medicine for approximately 1 to 1-1/2 months.  When at PCP she is typically checked for STIs and has been positive for yeast multiple times, unsure if Diflucan has been helpful as symptoms return every month and mild pelvic pain persists.  Last seen by gynecologist in 2021, endorses upcoming appointment in November which is the earliest she was able to be seen.  Last Pap smear 2019, negative.  Had ultrasound of the pelvis in February 2023 negative.  Denies change in vaginal bleeding.       Past Medical History:  Diagnosis Date   Anemia    External hemorrhoids    GERD (gastroesophageal reflux disease)    Vaginal Pap smear, abnormal     Patient Active Problem List   Diagnosis Date Noted   Candida vaginitis 08/02/2022   Pelvic pain in female 10/31/2020   MENORRHALGIA 07/18/2008    Past Surgical History:  Procedure Laterality Date   NO PAST SURGERIES     WISDOM TOOTH EXTRACTION      OB History     Gravida  2   Para  2   Term  2   Preterm      AB      Living  2      SAB      IAB      Ectopic      Multiple  0   Live Births   2            Home Medications    Prior to Admission medications   Medication Sig Start Date End Date Taking? Authorizing Provider  DULoxetine (CYMBALTA) 20 MG capsule TAKE 1 CAPSULE(20 MG) BY MOUTH DAILY 09/09/22  Yes Rema Fendt, NP    Family History History reviewed. No pertinent family history.  Social History Social History   Tobacco Use   Smoking status: Never    Passive exposure: Never   Smokeless tobacco: Never  Vaping Use   Vaping Use: Never used  Substance Use Topics   Alcohol use: No   Drug use: No     Allergies   Neutrogena lip moisturizer and Sunscreens   Review of Systems Review of Systems Defer to HPI    Physical Exam Triage Vital Signs ED Triage Vitals  Enc Vitals Group     BP 10/01/22 1615 128/86     Pulse Rate 10/01/22 1615 (!) 103     Resp 10/01/22 1615 18     Temp 10/01/22 1615 98.1 F (36.7 C)     Temp Source 10/01/22 1615 Oral     SpO2 10/01/22 1615 98 %  Weight --      Height --      Head Circumference --      Peak Flow --      Pain Score 10/01/22 1612 7     Pain Loc --      Pain Edu? --      Excl. in [place]? --    No data found.  Updated Vital Signs BP 128/86 (BP Location: Left Arm)   Pulse (!) 103   Temp 98.1 F (36.7 C) (Oral)   Resp 18   LMP 09/17/2022 (Approximate)   SpO2 98%   Visual Acuity Right Eye Distance:   Left Eye Distance:   Bilateral Distance:    Right Eye Near:   Left Eye Near:    Bilateral Near:     Physical Exam Constitutional:      Appearance: Normal appearance.  HENT:     Head: Normocephalic.  Pulmonary:     Effort: Pulmonary effort is normal.  Abdominal:     General: Abdomen is flat. Bowel sounds are normal.     Palpations: Abdomen is soft.  Genitourinary:    Comments: deferred Neurological:     Mental Status: She is alert and oriented to person, place, and time. Mental status is at baseline.  Psychiatric:        Mood and Affect: Mood normal.        Behavior: Behavior  normal.      UC Treatments / Results  Labs (all labs ordered are listed, but only abnormal results are displayed) Labs Reviewed - No data to display  EKG   Radiology No results found.  Procedures Procedures (including critical care time)  Medications Ordered in UC Medications - No data to display  Initial Impression / Assessment and Plan / UC Course  I have reviewed the triage vital signs and the nursing notes.  Pertinent labs & imaging results that were available during my care of the patient were reviewed by me and considered in my medical decision making (see chart for details).  Pelvic pain  Current exacerbation of symptoms is most likely related to yeast vaginitis, discussed with patient as she has had reoccurring infections, yeast trigger is most likely menstruation as she typically has exacerbated symptoms after it, vaginal swab pending, will treat prophylactically, prescribe Diflucan and discussed administration, suggested after upcoming menstruation that she attempt use of 1 to 3-day Monistat beginning last day of cycle in an attempt to prevent occurrence of yeast.  Unknown known etiology for persisting pelvic pain and discussed with patient that she will need additional work-up completed by gynecology, strongly advised to keep upcoming appointment in November Final Clinical Impressions(s) / UC Diagnoses   Final diagnoses:  None   Discharge Instructions   None    ED Prescriptions   None    PDMP not reviewed this encounter.   Hans Eden, NP 10/01/22 [phone]

## 2022-10-02 LAB — CERVICOVAGINAL ANCILLARY ONLY
Bacterial Vaginitis (gardnerella): NEGATIVE
Candida Glabrata: NEGATIVE
Candida Vaginitis: POSITIVE — AB
Chlamydia: NEGATIVE
Comment: NEGATIVE
Comment: NEGATIVE
Comment: NEGATIVE
Comment: NEGATIVE
Comment: NEGATIVE
Comment: NORMAL
Neisseria Gonorrhea: NEGATIVE
Trichomonas: NEGATIVE

## 2022-11-06 ENCOUNTER — Ambulatory Visit: Payer: Medicaid Other | Admitting: Obstetrics and Gynecology

## 2023-05-21 ENCOUNTER — Other Ambulatory Visit: Payer: Self-pay | Admitting: *Deleted

## 2023-05-21 DIAGNOSIS — G8929 Other chronic pain: Secondary | ICD-10-CM

## 2023-05-23 MED ORDER — DULOXETINE HCL 20 MG PO CPEP: ORAL_CAPSULE | ORAL | 0 refills | Status: DC

## 2023-05-26 ENCOUNTER — Ambulatory Visit: Payer: Self-pay | Admitting: *Deleted

## 2023-05-26 NOTE — Telephone Encounter (Signed)
Summary: Vomitting & Nausea Advice   Pt is calling to report that she is very cold, light headed, nausea, and vomiting for one week. Please advise       Attempted to call patient- no answer- left message to return call.

## 2023-05-26 NOTE — Telephone Encounter (Signed)
  Chief Complaint: Nausea, vomiting, bloating, feels cold, light headed. Symptoms: above Frequency: Monthly Pertinent Negatives: Patient denies Fever,  Disposition: [] ED /[] Urgent Care (no appt availability in office) / [x] Appointment(In office/virtual)/ []  [facility]/ [] Home Care/ [] Refused Recommended Disposition /[] [place]/ []  Follow-up with PCP Additional Notes: Pt called stating that monthly she has 1 week where she feels very poorly. She states she has nausea, vomiting, bloating and feels very cold. Pt thinks it may be a hormonal imbalance.    Summary: Vomitting & Nausea Advice   Pt is calling to report that she is very cold, light headed, nausea, and vomiting for one week. Please advise       Reason for Disposition  Nausea lasts > 1 week  Answer Assessment - Initial Assessment Questions 1. NAUSEA SEVERITY: "How bad is the nausea?" (e.g., mild, moderate, severe; dehydration, weight loss)   - MILD: loss of appetite without change in eating habits   - MODERATE: decreased oral intake without significant weight loss, dehydration, or malnutrition   - SEVERE: inadequate caloric or fluid intake, significant weight loss, symptoms of dehydration     mild 2. ONSET: "When did the nausea begin?"     10 days before period 3. VOMITING: "Any vomiting?" If Yes, ask: "How many times today?"     yes 4. RECURRENT SYMPTOM: "Have you had nausea before?" If Yes, ask: "When was the last time?" "What happened that time?"     monthly 5. CAUSE: "What do you think is causing the nausea?"     Hormonal imbalance  Protocols used: Nausea-A-AH

## 2023-05-27 NOTE — Telephone Encounter (Signed)
Called patient and was infromed that she was  scheduled for an apt. 05/28/2023. Has completed Duloxetine and requesting  refills although medication did not help.  Advised can address at OV.  Verbalized understanding.

## 2023-05-27 NOTE — Progress Notes (Unsigned)
Patient ID: Brooke Bush, female    DOB: [birth year]  MRN: [id number]  CC: Follow-Up  Subjective: Brooke Bush is a 32 y.o. female who presents for follow-up.  Her concerns today include:  05/26/2023 per triage RN call note: Chief Complaint: Nausea, vomiting, bloating, feels cold, light headed. Symptoms: above Frequency: Monthly Pertinent Negatives: Patient denies Fever,  Disposition: [] ED /[] Urgent Care (no appt availability in office) / [x] Appointment(In office/virtual)/ []  [facility]/ [] Home Care/ [] Refused Recommended Disposition /[]  Mobile Bus/ []  Follow-up with PCP Additional Notes: Pt called stating that monthly she has 1 week where she feels very poorly. She states she has nausea, vomiting, bloating and feels very cold. Pt thinks it may be a hormonal imbalance.           Summary: Vomitting & Nausea Advice     Pt is calling to report that she is very cold, light headed, nausea, and vomiting for one week. Please advise        Reason for Disposition  Nausea lasts > 1 week  Answer Assessment - Initial Assessment Questions 1. NAUSEA SEVERITY: "How bad is the nausea?" (e.g., mild, moderate, severe; dehydration, weight loss)   - MILD: loss of appetite without change in eating habits   - MODERATE: decreased oral intake without significant weight loss, dehydration, or malnutrition   - SEVERE: inadequate caloric or fluid intake, significant weight loss, symptoms of dehydration     mild 2. ONSET: "When did the nausea begin?"     10 days before period 3. VOMITING: "Any vomiting?" If Yes, ask: "How many times today?"     yes 4. RECURRENT SYMPTOM: "Have you had nausea before?" If Yes, ask: "When was the last time?" "What happened that time?"     monthly 5. CAUSE: "What do you think is causing the nausea?"     Hormonal imbalance  Protocols used: Nausea-A-AH  Summary: Vomitting & Nausea Advice     Pt is calling to report that she is very cold, light  headed, nausea, and vomiting for one week. Please advise        Attempted to call patient- no answer- left message to return call.  Today's visit 05/28/2023: Reports bilateral sore breasts, headaches, and abdominal cramps 5 days before period begins. She denies associated red flag symptoms. States her breasts "feels like they did when I was pregnant but I'm not". Reports once period begins symptoms resolve. Endorses intermittent pelvic pain and denies associated red flag symptoms. Reports she was established with Gynecology in the past for pelvic pain management. Reports she has normal monthly periods which lasts 3 to 4 days. LMP: 5/10/204. She is not taking birth control. Reports history of anemia. States she thinks she is always cold because of her anemia history. No further issues/concerns for discussion today.    Patient Active Problem List   Diagnosis Date Noted   Candida vaginitis 08/02/2022   Pelvic pain in female 10/31/2020   MENORRHALGIA 07/18/2008     Current Outpatient Medications on File Prior to Visit  Medication Sig Dispense Refill   DULoxetine (CYMBALTA) 20 MG capsule TAKE 1 CAPSULE(20 MG) BY MOUTH DAILY 30 capsule 0   No current facility-administered medications on file prior to visit.    Allergies  Allergen Reactions   Neutrogena Lip Moisturizer Rash   Sunscreens Rash    Social History   Socioeconomic History   Marital status: Significant Other    Spouse name: Not on file  Number of children: Not on file   Years of education: Not on file   Highest education level: Not on file  Occupational History   Not on file  Tobacco Use   Smoking status: Never    Passive exposure: Never   Smokeless tobacco: Never  Vaping Use   Vaping Use: Never used  Substance and Sexual Activity   Alcohol use: No   Drug use: No   Sexual activity: Yes    Birth control/protection: Condom  Other Topics Concern   Not on file  Social History Narrative   Not on file   Social  Determinants of Health   Financial Resource Strain: Not on file  Food Insecurity: No Food Insecurity (10/31/2020)   Hunger Vital Sign    Worried About Running Out of Food in the Last Year: Never true    Ran Out of Food in the Last Year: Never true  Transportation Needs: No Transportation Needs (10/31/2020)   PRAPARE - Administrator, Civil Service (Medical): No    Lack of Transportation (Non-Medical): No  Physical Activity: Not on file  Stress: Not on file  Social Connections: Not on file  Intimate Partner Violence: Not on file    No family history on file.  Past Surgical History:  Procedure Laterality Date   NO PAST SURGERIES     WISDOM TOOTH EXTRACTION      ROS: Review of Systems Negative except as stated above  PHYSICAL EXAM: BP 96/69   Pulse 99   Temp 98.1 F (36.7 C) (Oral)   Resp 16   Wt 108 lb 12.8 oz (49.4 kg)   SpO2 96%   BMI 19.89 kg/m   Physical Exam HENT:     Head: Normocephalic and atraumatic.     Nose: Nose normal.     Mouth/Throat:     Mouth: Mucous membranes are moist.     Pharynx: Oropharynx is clear.  Eyes:     Extraocular Movements: Extraocular movements intact.     Conjunctiva/sclera: Conjunctivae normal.     Pupils: Pupils are equal, round, and reactive to light.  Cardiovascular:     Rate and Rhythm: Normal rate and regular rhythm.     Pulses: Normal pulses.     Heart sounds: Normal heart sounds.  Pulmonary:     Effort: Pulmonary effort is normal.     Breath sounds: Normal breath sounds.  Abdominal:     General: Bowel sounds are normal.     Palpations: Abdomen is soft.  Musculoskeletal:     Cervical back: Normal range of motion and neck supple.  Neurological:     General: No focal deficit present.     Mental Status: She is alert and oriented to person, place, and time.  Psychiatric:        Mood and Affect: Mood normal.        Behavior: Behavior normal.     ASSESSMENT AND PLAN: 1. PMS (premenstrual syndrome) -  Routine screening.  - Referral to Gynecology for further evaluation/management. During the interim follow-up with primary provider as scheduled.  - POCT urine pregnancy - Luteinizing hormone - Follicle stimulating hormone - Cervicovaginal ancillary only - HIV antibody (with reflex) - Ambulatory referral to Gynecology - POCT URINALYSIS DIP (CLINITEK); Future  2. History of anemia - Routine screening.  - CBC   Patient was given the opportunity to ask questions.  Patient verbalized understanding of the plan and was able to repeat key elements of the plan.  Patient was given clear instructions to go to Emergency Department or return to medical center if symptoms don't improve, worsen, or new problems develop.The patient verbalized understanding.   Orders Placed This Encounter  Procedures   CBC   Luteinizing hormone   Follicle stimulating hormone   HIV antibody (with reflex)   Ambulatory referral to Gynecology   POCT urine pregnancy   POCT URINALYSIS DIP (CLINITEK)    Return for Follow-Up or next available Georganna Skeans, MD.  Rema Fendt, NP

## 2023-05-28 ENCOUNTER — Ambulatory Visit: Payer: Medicaid Other | Admitting: Family

## 2023-05-28 ENCOUNTER — Encounter: Payer: Self-pay | Admitting: Family

## 2023-05-28 VITALS — BP 96/69 | HR 99 | Temp 98.1°F | Resp 16 | Wt 108.8 lb

## 2023-05-28 DIAGNOSIS — N943 Premenstrual tension syndrome: Secondary | ICD-10-CM | POA: Diagnosis not present

## 2023-05-28 DIAGNOSIS — Z862 Personal history of diseases of the blood and blood-forming organs and certain disorders involving the immune mechanism: Secondary | ICD-10-CM | POA: Diagnosis not present

## 2023-05-28 DIAGNOSIS — R110 Nausea: Secondary | ICD-10-CM

## 2023-05-28 LAB — POCT URINE PREGNANCY: Preg Test, Ur: NEGATIVE

## 2023-05-28 NOTE — Progress Notes (Unsigned)
Patient c/o always having some sort of headache/cramps/or cold before her monthly cycle.  -Patient said think she may have some sort of inbalance

## 2023-05-30 LAB — CBC
Hematocrit: 40.3 % (ref 34.0–46.6)
Hemoglobin: 13.1 g/dL (ref 11.1–15.9)
MCH: 25 pg — ABNORMAL LOW (ref 26.6–33.0)
MCHC: 32.5 g/dL (ref 31.5–35.7)
MCV: 77 fL — ABNORMAL LOW (ref 79–97)
Platelets: 319 10*3/uL (ref 150–450)
RBC: 5.24 x10E6/uL (ref 3.77–5.28)
RDW: 13 % (ref 11.7–15.4)
WBC: 9.8 10*3/uL (ref 3.4–10.8)

## 2023-05-30 LAB — LUTEINIZING HORMONE: LH: 3.5 m[IU]/mL

## 2023-05-30 LAB — FOLLICLE STIMULATING HORMONE: FSH: 2.4 m[IU]/mL

## 2023-05-30 LAB — HIV ANTIBODY (ROUTINE TESTING W REFLEX): HIV Screen 4th Generation wRfx: NONREACTIVE

## 2023-08-22 ENCOUNTER — Ambulatory Visit: Payer: Self-pay

## 2023-08-22 NOTE — Telephone Encounter (Signed)
Chief Complaint: Vaginal area itch, pain - yeast infection Symptoms: above Frequency: 2 nights Pertinent Negatives: Patient denies  Disposition: [] ED /[x] Urgent Care (no appt availability in office) / [] Appointment(In office/virtual)/ []  [facility]/ [] Home Care/ [] Refused Recommended Disposition /[] [facility]/ []  Follow-up with PCP Additional Notes: Call was transferred as abdominal pain. Pt seems to have concerns with vaginal area. Pt was at work, and seemed hesitant to discuss.  Pt states she has a yeast infection, but also bloating. No appts in office pt will go to UC this evening.    Reason for Disposition  [1] MILD-MODERATE pain AND [2] constant and [3] present < 2 hours  [1] Vaginal itching AND [2] not improved > 3 days following Care Advice  Answer Assessment - Initial Assessment Questions 1. LOCATION: "Where does it hurt?"      Lower left side - almost to groin 2. RADIATION: "Does the pain shoot anywhere else?" (e.g., chest, back)     Yes down the side 3. ONSET: "When did the pain begin?" (e.g., minutes, hours or days ago)      Night before last 4. SUDDEN: "Gradual or sudden onset?"     gradual 5. PATTERN "Does the pain come and go, or is it constant?"    - If it comes and goes: "How long does it last?" "Do you have pain now?"     (Note: Comes and goes means the pain is intermittent. It goes away completely between bouts.)    - If constant: "Is it getting better, staying the same, or getting worse?"      (Note: Constant means the pain never goes away completely; most serious pain is constant and gets worse.)      constant 6. SEVERITY: "How bad is the pain?"  (e.g., Scale 1-10; mild, moderate, or severe)    - MILD (1-3): Doesn't interfere with normal activities, abdomen soft and not tender to touch.     - MODERATE (4-7): Interferes with normal activities or awakens from sleep, abdomen tender to touch.     - SEVERE (8-10): Excruciating pain, doubled  over, unable to do any normal activities.       Yes, when she walks 7. RECURRENT SYMPTOM: "Have you ever had this type of stomach pain before?" If Yes, ask: "When was the last time?" and "What happened that time?"      yes 8. CAUSE: "What do you think is causing the stomach pain?"     Yeast infection 9. RELIEVING/AGGRAVATING FACTORS: "What makes it better or worse?" (e.g., antacids, bending or twisting motion, bowel movement)     no 10. OTHER SYMPTOMS: "Do you have any other symptoms?" (e.g., back pain, diarrhea, fever, urination pain, vomiting)       bloating  Answer Assessment - Initial Assessment Questions 1. SYMPTOM: "What's the main symptom you're concerned about?" (e.g., pain, itching, dryness)     Itching pain 2. LOCATION: "Where is the  *No Answer* located?" (e.g., inside/outside, left/right)     Left side 3. ONSET: "When did the  *No Answer*  start?"     2 nights ago 4. PAIN: "Is there any pain?" If Yes, ask: "How bad is it?" (Scale: 1-10; mild, moderate, severe)   -  MILD (1-3): Doesn't interfere with normal activities.    -  MODERATE (4-7): Interferes with normal activities (e.g., work or school) or awakens from sleep.     -  SEVERE (8-10): Excruciating pain, unable to do any normal activities.  mild 5. ITCHING: "Is there any itching?" If Yes, ask: "How bad is it?" (Scale: 1-10; mild, moderate, severe)     yes 6. CAUSE: "What do you think is causing the discharge?" "Have you had the same problem before? What happened then?"     Yeast infection 7. OTHER SYMPTOMS: "Do you have any other symptoms?" (e.g., fever, itching, vaginal bleeding, pain with urination, injury to genital area, vaginal foreign body)     Abdominal bloating  Protocols used: Abdominal Pain - Female-A-AH, Vaginal Symptoms-A-AH

## 2023-09-22 ENCOUNTER — Encounter (HOSPITAL_COMMUNITY): Payer: Self-pay

## 2023-09-22 ENCOUNTER — Ambulatory Visit (HOSPITAL_COMMUNITY)
Admission: EM | Admit: 2023-09-22 | Discharge: 2023-09-22 | Disposition: A | Discharge status: Home / Self Care | Payer: Medicaid Other | Attending: Family Medicine | Admitting: Family Medicine

## 2023-09-22 DIAGNOSIS — R1032 Left lower quadrant pain: Secondary | ICD-10-CM | POA: Diagnosis present

## 2023-09-22 LAB — POCT URINE PREGNANCY: Preg Test, Ur: NEGATIVE

## 2023-09-22 LAB — POCT URINALYSIS DIP (MANUAL ENTRY)
Bilirubin, UA: NEGATIVE
Glucose, UA: NEGATIVE mg/dL
Ketones, POC UA: NEGATIVE mg/dL
Leukocytes, UA: NEGATIVE
Nitrite, UA: NEGATIVE
Protein Ur, POC: NEGATIVE mg/dL
Spec Grav, UA: 1.02 (ref 1.010–1.025)
Urobilinogen, UA: 0.2 U/dL
pH, UA: 6 (ref 5.0–8.0)

## 2023-09-22 MED ORDER — CIPROFLOXACIN HCL 500 MG PO TABS: 500.0000 mg | ORAL_TABLET | Freq: Two times a day (BID) | ORAL | 0 refills | Status: AC

## 2023-09-22 MED ORDER — TAMSULOSIN HCL 0.4 MG PO CAPS: 0.4000 mg | ORAL_CAPSULE | Freq: Every day | ORAL | 0 refills | Status: DC

## 2023-09-22 MED ORDER — NAPROXEN 500 MG PO TABS: 500.0000 mg | ORAL_TABLET | Freq: Two times a day (BID) | ORAL | 0 refills | Status: DC | PRN

## 2023-09-22 NOTE — Discharge Instructions (Addendum)
Urinalysis had blood on it.  Urine culture is sent  Take Cipro 500 mg--1 tablet 2 times daily for 7 days; this is your antibiotic  Tamsulosin 0.4 mg--1 daily.  This is to help urinary flow  Take naproxen 500 mg--1 tablet every 12 hours as needed for pain  Please follow-up with your primary care about this issue.  Also call the urology office for evaluation

## 2023-09-22 NOTE — ED Provider Notes (Signed)
[facility]    CSN: [id number] Arrival date & time: 09/22/23  1812      History   Chief Complaint Chief Complaint  Patient presents with   Flank Pain    HPI Brooke Bush is a 32 y.o. female.    Flank Pain  Here for pain in her left lower side and left lower quadrant.  It is been going on for the last 3 to 4 days.  It radiates into her right inguinal area.  Of note staff at triage had said she had right lower quadrant pain, but she does not she is hurting on the left.  Urinating makes her more uncomfortable.  No fever or chills.  She did have some nausea and vomiting yesterday and the day before, but that is better today.     last menstrual cycle was September 15 (she had mistakenly told staff it was August 15 at triage).    No allergies to medications   Past Medical History:  Diagnosis Date   Anemia    External hemorrhoids    GERD (gastroesophageal reflux disease)    Vaginal Pap smear, abnormal     Patient Active Problem List   Diagnosis Date Noted   Candida vaginitis 08/02/2022   Pelvic pain in female 10/31/2020   MENORRHALGIA 07/18/2008    Past Surgical History:  Procedure Laterality Date   NO PAST SURGERIES     WISDOM TOOTH EXTRACTION      OB History     Gravida  2   Para  2   Term  2   Preterm      AB      Living  2      SAB      IAB      Ectopic      Multiple  0   Live Births  2            Home Medications    Prior to Admission medications   Medication Sig Start Date End Date Taking? Authorizing Provider  ciprofloxacin (CIPRO) 500 MG tablet Take 1 tablet (500 mg total) by mouth 2 (two) times daily for 7 days. 09/22/23 09/29/23 Yes Zenia Resides, MD  naproxen (NAPROSYN) 500 MG tablet Take 1 tablet (500 mg total) by mouth 2 (two) times daily as needed (pain). 09/22/23  Yes Zenia Resides, MD  tamsulosin (FLOMAX) 0.4 MG CAPS capsule Take 1 capsule (0.4 mg total) by mouth daily. 09/22/23  Yes Zenia Resides, MD    Family History History reviewed. No pertinent family history.  Social History Social History   Tobacco Use   Smoking status: Never    Passive exposure: Never   Smokeless tobacco: Never  Vaping Use   Vaping status: Never Used  Substance Use Topics   Alcohol use: No   Drug use: No     Allergies   Neutrogena lip moisturizer and Sunscreens   Review of Systems Review of Systems  Genitourinary:  Positive for flank pain.     Physical Exam Triage Vital Signs ED Triage Vitals [09/22/23 1844]  Encounter Vitals Group     BP 112/77     Systolic BP Percentile      Diastolic BP Percentile      Pulse Rate 98     Resp 16     Temp 98.3 F (36.8 C)     Temp Source Oral     SpO2 98 %     Weight  Height      Head Circumference      Peak Flow      Pain Score 6     Pain Loc      Pain Education      Exclude from Growth Chart    No data found.  Updated Vital Signs BP 112/77 (BP Location: Left Arm)   Pulse 98   Temp 98.3 F (36.8 C) (Oral)   Resp 16   LMP 08/07/2023 (Approximate)   SpO2 98%   Visual Acuity Right Eye Distance:   Left Eye Distance:   Bilateral Distance:    Right Eye Near:   Left Eye Near:    Bilateral Near:     Physical Exam Vitals reviewed.  Constitutional:      General: She is not in acute distress.    Appearance: She is not ill-appearing, toxic-appearing or diaphoretic.  HENT:     Mouth/Throat:     Mouth: Mucous membranes are moist.  Eyes:     Extraocular Movements: Extraocular movements intact.     Conjunctiva/sclera: Conjunctivae normal.     Pupils: Pupils are equal, round, and reactive to light.  Cardiovascular:     Rate and Rhythm: Normal rate and regular rhythm.     Heart sounds: No murmur heard. Pulmonary:     Effort: Pulmonary effort is normal.     Breath sounds: Normal breath sounds.  Abdominal:     General: Bowel sounds are normal. There is no distension.     Palpations: Abdomen is soft.     Tenderness: There  is abdominal tenderness (There is tenderness in her left lower quadrant and suprapubic area). There is no right CVA tenderness or left CVA tenderness.  Musculoskeletal:     Cervical back: Neck supple.  Lymphadenopathy:     Cervical: No cervical adenopathy.  Neurological:     General: No focal deficit present.     Mental Status: She is alert and oriented to person, place, and time.  Psychiatric:        Behavior: Behavior normal.      UC Treatments / Results  Labs (all labs ordered are listed, but only abnormal results are displayed) Labs Reviewed  POCT URINALYSIS DIP (MANUAL ENTRY) - Abnormal; Notable for the following components:      Result Value   Clarity, UA cloudy (*)    Blood, UA moderate (*)    All other components within normal limits  URINE CULTURE  POCT URINE PREGNANCY    EKG   Radiology No results found.  Procedures Procedures (including critical care time)  Medications Ordered in UC Medications - No data to display  Initial Impression / Assessment and Plan / UC Course  I have reviewed the triage vital signs and the nursing notes.  Pertinent labs & imaging results that were available during my care of the patient were reviewed by me and considered in my medical decision making (see chart for details).       Urinalysis shows a moderate amount of RBCs.  No nitrites or leuks.  Urine culture is sent UPT is negative  Cipro was sent for possible UTI, and Flomax is sent for possible renal stone.  She declined my offer of Toradol, so naproxen is sent for pain. Final Clinical Impressions(s) / UC Diagnoses   Final diagnoses:  LLQ pain     Discharge Instructions      Urinalysis had blood on it.  Urine culture is sent  Take Cipro 500 mg--1 tablet  2 times daily for 7 days; this is your antibiotic  Tamsulosin 0.4 mg--1 daily.  This is to help urinary flow  Take naproxen 500 mg--1 tablet every 12 hours as needed for pain  Please follow-up with your  primary care about this issue.  Also call the urology office for evaluation      ED Prescriptions     Medication Sig Dispense Auth. Provider   ciprofloxacin (CIPRO) 500 MG tablet Take 1 tablet (500 mg total) by mouth 2 (two) times daily for 7 days. 14 tablet Georgi Navarrete, Janace Aris, MD   tamsulosin (FLOMAX) 0.4 MG CAPS capsule Take 1 capsule (0.4 mg total) by mouth daily. 30 capsule Zenia Resides, MD   naproxen (NAPROSYN) 500 MG tablet Take 1 tablet (500 mg total) by mouth 2 (two) times daily as needed (pain). 30 tablet Ardyn Forge, Janace Aris, MD      PDMP not reviewed this encounter.   Zenia Resides, MD 09/22/23 [phone]

## 2023-09-22 NOTE — ED Triage Notes (Signed)
Pt presents to the office for RLQ pain that started 3-4 days ago.

## 2023-09-24 LAB — URINE CULTURE: Culture: 20000 — AB

## 2023-10-06 ENCOUNTER — Encounter: Payer: Medicaid Other | Admitting: Family

## 2023-10-06 ENCOUNTER — Telehealth: Payer: Self-pay | Admitting: Family Medicine

## 2023-10-06 NOTE — Progress Notes (Signed)
Erroneous encounter-disregard

## 2023-10-06 NOTE — Telephone Encounter (Signed)
Called patient to schedule missed appointment with provider left voicemail to call office back

## 2024-01-05 ENCOUNTER — Ambulatory Visit (HOSPITAL_COMMUNITY)
Admission: EM | Admit: 2024-01-05 | Discharge: 2024-01-05 | Disposition: A | Discharge status: Home / Self Care | Payer: Medicaid Other | Attending: Internal Medicine | Admitting: Internal Medicine

## 2024-01-05 ENCOUNTER — Encounter (HOSPITAL_COMMUNITY): Payer: Self-pay

## 2024-01-05 DIAGNOSIS — H5789 Other specified disorders of eye and adnexa: Secondary | ICD-10-CM | POA: Diagnosis not present

## 2024-01-05 MED ORDER — PREDNISONE 10 MG PO TABS: 40.0000 mg | ORAL_TABLET | Freq: Every day | ORAL | 0 refills | Status: AC

## 2024-01-05 MED ORDER — LORATADINE 10 MG PO TABS: 10.0000 mg | ORAL_TABLET | Freq: Every day | ORAL | 1 refills | Status: AC

## 2024-01-05 MED ORDER — PREDNISOLONE ACETATE 1 % OP SUSP: 1.0000 [drp] | Freq: Four times a day (QID) | OPHTHALMIC | 0 refills | Status: AC

## 2024-01-05 NOTE — Discharge Instructions (Addendum)
 Symptoms most consistent with irritant dermatitis of the eyes.  This could be secondary to chemical exposure versus environmental exposure.  We will treat with the following: Claritin  10 mg daily  Prednisone  40 mg daily for 5 days. Take this in the morning. This is a steroid to help inflammation, swelling and redness Pred-Forte eye drops 4 times daily for 10 days then once daily for 7 days then discontinue.  May use as needed after that for redness and swelling If symptoms persist recommend following up with an ophthalmologist Avoid touching your face at work without washing your hands first. Return to urgent care or PCP if symptoms worsen or fail to resolve.

## 2024-01-05 NOTE — ED Provider Notes (Signed)
 [facility]    CSN: [id number] Arrival date & time: 01/05/24  1508      History   Chief Complaint Chief Complaint  Patient presents with   Eye Problem    HPI Brooke Bush is a 33 y.o. female.   33 year old female who presents to urgent care with complaints of eyelid swelling, redness and peeling.  This has been going on for 6 weeks.  It will run in cycles where it will become red and swollen and then start to fade and she will get some peeling of the skin around the area.  She has been using over-the-counter eyedrops and Benadryl  but it has not helped.  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  She denies any congestion, fevers, chills, visual disturbance, discharge from the eyes.     Eye Problem Associated symptoms: redness   Associated symptoms: no vomiting     Past Medical History:  Diagnosis Date   Anemia    External hemorrhoids    GERD (gastroesophageal reflux disease)    Vaginal Pap smear, abnormal     Patient Active Problem List   Diagnosis Date Noted   Candida vaginitis 08/02/2022   Pelvic pain in female 10/31/2020   MENORRHALGIA 07/18/2008    Past Surgical History:  Procedure Laterality Date   NO PAST SURGERIES     WISDOM TOOTH EXTRACTION      OB History     Gravida  2   Para  2   Term  2   Preterm      AB      Living  2      SAB      IAB      Ectopic      Multiple  0   Live Births  2            Home Medications    Prior to Admission medications   Not on File    Family History History reviewed. No pertinent family history.  Social History Social History   Tobacco Use   Smoking status: Never    Passive exposure: Never   Smokeless tobacco: Never  Vaping Use   Vaping status: Never Used  Substance Use Topics   Alcohol use: No   Drug use: No     Allergies   Neutrogena lip moisturizer and Sunscreens   Review of Systems Review of Systems  Constitutional:  Negative for chills and fever.   HENT:  Negative for ear pain and sore throat.   Eyes:  Positive for pain and redness. Negative for visual disturbance.  Respiratory:  Negative for cough and shortness of breath.   Cardiovascular:  Negative for chest pain and palpitations.  Gastrointestinal:  Negative for abdominal pain and vomiting.  Genitourinary:  Negative for dysuria and hematuria.  Musculoskeletal:  Negative for arthralgias and back pain.  Skin:  Negative for color change and rash.  Neurological:  Negative for seizures and syncope.  All other systems reviewed and are negative.    Physical Exam Triage Vital Signs ED Triage Vitals  Encounter Vitals Group     BP 01/05/24 1715 121/82     Systolic BP Percentile --      Diastolic BP Percentile --      Pulse Rate 01/05/24 1715 96     Resp 01/05/24 1715 16     Temp 01/05/24 1715 97.8 F (36.6 C)     Temp Source 01/05/24 1715 Oral  SpO2 01/05/24 1715 98 %     Weight 01/05/24 1716 113 lb (51.3 kg)     Height 01/05/24 1716 5' 2 (1.575 m)     Head Circumference --      Peak Flow --      Pain Score 01/05/24 1717 0     Pain Loc --      Pain Education --      Exclude from Growth Chart --    No data found.  Updated Vital Signs BP 121/82 (BP Location: Left Arm)   Pulse 96   Temp 97.8 F (36.6 C) (Oral)   Resp 16   Ht 5' 2 (1.575 m)   Wt 113 lb (51.3 kg)   LMP 12/15/2023 (Approximate)   SpO2 98%   BMI 20.67 kg/m   Visual Acuity Right Eye Distance:   Left Eye Distance:   Bilateral Distance:    Right Eye Near:   Left Eye Near:    Bilateral Near:     Physical Exam Vitals and nursing note reviewed.  Constitutional:      General: She is not in acute distress.    Appearance: She is well-developed.  HENT:     Head: Normocephalic and atraumatic.  Eyes:     Extraocular Movements:     Right eye: Normal extraocular motion.     Left eye: Normal extraocular motion.     Conjunctiva/sclera: Conjunctivae normal.     Right eye: Right conjunctiva is not  injected.     Left eye: Left conjunctiva is not injected.     Comments: Mild swelling of the upper and lower eyelids with mild erythema  Cardiovascular:     Rate and Rhythm: Normal rate and regular rhythm.     Heart sounds: No murmur heard. Pulmonary:     Effort: Pulmonary effort is normal. No respiratory distress.     Breath sounds: Normal breath sounds.  Abdominal:     Palpations: Abdomen is soft.     Tenderness: There is no abdominal tenderness.  Musculoskeletal:        General: No swelling.     Cervical back: Neck supple.  Skin:    General: Skin is warm and dry.     Capillary Refill: Capillary refill takes less than 2 seconds.  Neurological:     Mental Status: She is alert.  Psychiatric:        Mood and Affect: Mood normal.      UC Treatments / Results  Labs (all labs ordered are listed, but only abnormal results are displayed) Labs Reviewed - No data to display  EKG   Radiology No results found.  Procedures Procedures (including critical care time)  Medications Ordered in UC Medications - No data to display  Initial Impression / Assessment and Plan / UC Course  I have reviewed the triage vital signs and the nursing notes.  Pertinent labs & imaging results that were available during my care of the patient were reviewed by me and considered in my medical decision making (see chart for details).     Irritation of both eyes   Symptoms most consistent with irritant dermatitis of the eyes.  This could be secondary to chemical exposure versus environmental exposure.  We will treat with the following: Claritin  10 mg daily  Prednisone  40 mg daily for 5 days. Take this in the morning. This is a steroid to help inflammation, swelling and redness Pred-Forte eye drops 4 times daily for 10 days then once daily  for 7 days then discontinue.  May use as needed after that for redness and swelling If symptoms persist recommend following up with an ophthalmologist Avoid  touching your face at work without washing your hands first. Return to urgent care or PCP if symptoms worsen or fail to resolve.    Final Clinical Impressions(s) / UC Diagnoses   Final diagnoses:  None   Discharge Instructions   None    ED Prescriptions   None    PDMP not reviewed this encounter.   Teresa Almarie LABOR, PA-C 01/05/24 1749

## 2024-01-05 NOTE — ED Triage Notes (Signed)
 Patient here today with c/o bilat eye swelling, burning, discomfort, dryness, and drainage X 6 weeks. Patient states that the swelling comes and goes and when the swelling goes down she will noticed that the skin around her eyes will peel. She has tried taking Benadryl  with no relief. She has also tried Pataday eye drops with little relief with the redness. Patient also states that sometimes her face will seem swollen.
